# Patient Record
Sex: Female | Born: 1980 | Race: Black or African American | Hispanic: No | Marital: Single | Smoking: Never smoker
Health system: Southern US, Community
[De-identification: ages and names within clinical notes are randomized; demographics above are authoritative.]

## PROBLEM LIST (undated history)

## (undated) DIAGNOSIS — I10 Essential (primary) hypertension: Secondary | ICD-10-CM

## (undated) HISTORY — PX: ABDOMINAL HYSTERECTOMY: SHX81

---

## 2014-03-04 ENCOUNTER — Emergency Department: Payer: Self-pay | Admitting: Emergency Medicine

## 2014-03-04 LAB — COMPREHENSIVE METABOLIC PANEL
ALBUMIN: 3.6 g/dL (ref 3.4–5.0)
ALK PHOS: 89 U/L
AST: 52 U/L — AB (ref 15–37)
Anion Gap: 7 (ref 7–16)
BILIRUBIN TOTAL: 0.2 mg/dL (ref 0.2–1.0)
BUN: 12 mg/dL (ref 7–18)
CO2: 23 mmol/L (ref 21–32)
CREATININE: 0.76 mg/dL (ref 0.60–1.30)
Calcium, Total: 8.7 mg/dL (ref 8.5–10.1)
Chloride: 108 mmol/L — ABNORMAL HIGH (ref 98–107)
EGFR (African American): >60
GLUCOSE: 132 mg/dL — AB (ref 65–99)
OSMOLALITY: 277 (ref 275–301)
Potassium: 4.2 mmol/L (ref 3.5–5.1)
SGPT (ALT): 34 U/L
Sodium: 138 mmol/L (ref 136–145)
TOTAL PROTEIN: 7.6 g/dL (ref 6.4–8.2)

## 2014-03-04 LAB — CBC WITH DIFFERENTIAL/PLATELET
Basophil #: 0.2 10*3/uL — ABNORMAL HIGH (ref 0.0–0.1)
Basophil %: 1.5 %
Eosinophil #: 0 10*3/uL (ref 0.0–0.7)
Eosinophil %: 0.3 %
HCT: 32.1 % — AB (ref 35.0–47.0)
HGB: 9.5 g/dL — ABNORMAL LOW (ref 12.0–16.0)
LYMPHS PCT: 23.4 %
Lymphocyte #: 2.8 10*3/uL (ref 1.0–3.6)
MCH: 22.3 pg — ABNORMAL LOW (ref 26.0–34.0)
MCHC: 29.7 g/dL — AB (ref 32.0–36.0)
MCV: 75 fL — ABNORMAL LOW (ref 80–100)
Monocyte #: 0.5 x10 3/mm (ref 0.2–0.9)
Monocyte %: 4.5 %
NEUTROS PCT: 70.3 %
Neutrophil #: 8.4 10*3/uL — ABNORMAL HIGH (ref 1.4–6.5)
Platelet: 415 10*3/uL (ref 150–440)
RBC: 4.26 10*6/uL (ref 3.80–5.20)
RDW: 27.6 % — ABNORMAL HIGH (ref 11.5–14.5)
WBC: 12 10*3/uL — ABNORMAL HIGH (ref 3.6–11.0)

## 2014-03-04 LAB — URINALYSIS, COMPLETE
Bacteria: NONE SEEN
RBC,UR: 4879 /HPF (ref 0–5)
Specific Gravity: 1.012 (ref 1.003–1.030)
Squamous Epithelial: NONE SEEN
WBC UR: 99 /HPF (ref 0–5)

## 2014-03-04 LAB — PREGNANCY, URINE: Pregnancy Test, Urine: NEGATIVE m[IU]/mL

## 2014-05-07 ENCOUNTER — Emergency Department: Payer: Self-pay | Admitting: Emergency Medicine

## 2014-05-07 LAB — INFLUENZA A,B,H1N1 - PCR (ARMC)
H1N1 flu by pcr: NOT DETECTED
Influenza A By PCR: NEGATIVE
Influenza B By PCR: NEGATIVE

## 2015-02-02 ENCOUNTER — Emergency Department
Admission: EM | Admit: 2015-02-02 | Discharge: 2015-02-02 | Disposition: A | Discharge status: Home / Self Care | Payer: Self-pay | Attending: Emergency Medicine | Admitting: Emergency Medicine

## 2015-02-02 ENCOUNTER — Encounter: Payer: Self-pay | Admitting: Medical Oncology

## 2015-02-02 DIAGNOSIS — M26621 Arthralgia of right temporomandibular joint: Secondary | ICD-10-CM | POA: Insufficient documentation

## 2015-02-02 DIAGNOSIS — I10 Essential (primary) hypertension: Secondary | ICD-10-CM | POA: Insufficient documentation

## 2015-02-02 HISTORY — DX: Essential (primary) hypertension: I10

## 2015-02-02 MED ORDER — MELOXICAM 15 MG PO TABS: 15.0000 mg | ORAL_TABLET | Freq: Every day | ORAL | Status: DC

## 2015-02-02 NOTE — ED Notes (Signed)
Rt sided facial/ ear pain x 4 days.

## 2015-02-02 NOTE — ED Notes (Signed)
AAOx3.  Skin warm and dry.  NAD 

## 2015-02-02 NOTE — ED Notes (Signed)
Having pain to right side of face /ear since last Thursday unsure if the pain is coming from toothache or ear infection. Denies fever or drainage from ear

## 2015-02-02 NOTE — Discharge Instructions (Signed)

## 2015-02-02 NOTE — ED Provider Notes (Signed)
[facility] Emergency Department Provider Note  ____________________________________________  Time seen: Approximately 1:44 PM  I have reviewed the triage vital signs and the nursing notes.   HISTORY  Chief Complaint Facial Pain    HPI Shayma Vaughan SineLumley Furney is a 34 y.o. female presents to the emergency department complaining of right sided face/ear pain since last 4 days ago. She states that the pain has waxed and waned anywhere from a 4 out of 10-10 out of 10. States that the pain can be described as an ache as well as sharp. She denies any injury to area. She denies any previous history of same. She denies any fevers or chills. She denies any difficulty swallowing or breathing.   Past Medical History  Diagnosis Date  . Hypertension     There are no active problems to display for this patient.   Past Surgical History  Procedure Laterality Date  . Abdominal hysterectomy      Current Outpatient Rx  Name  Route  Sig  Dispense  Refill  . meloxicam (MOBIC) 15 MG tablet   Oral   Take 1 tablet (15 mg total) by mouth daily.   30 tablet   0     Allergies Cashew nut oil  No family history on file.  Social History Social History  Substance Use Topics  . Smoking status: Never Smoker   . Smokeless tobacco: None  . Alcohol Use: None    Review of Systems Constitutional: No fever/chills Eyes: No visual changes. ENT: No sore throat. Endorses right-sided jaw/face/ear pain. Cardiovascular: Denies chest pain. Respiratory: Denies shortness of breath. Gastrointestinal: No abdominal pain.  No nausea, no vomiting.  No diarrhea.  No constipation. Genitourinary: Negative for dysuria. Musculoskeletal: Negative for back pain. Skin: Negative for rash. Neurological: Negative for headaches, focal weakness or numbness.  10-point ROS otherwise negative.  ____________________________________________   PHYSICAL EXAM:  VITAL SIGNS: ED Triage Vitals   Enc Vitals Group     BP 02/02/15 1243 146/96 mmHg     Pulse Rate 02/02/15 1243 90     Resp 02/02/15 1243 18     Temp 02/02/15 1243 97.7 F (36.5 C)     Temp Source 02/02/15 1243 Oral     SpO2 02/02/15 1243 97 %     Weight 02/02/15 1243 289 lb (131.09 kg)     Height 02/02/15 1243 5\' 10"  (1.778 m)     Head Cir --      Peak Flow --      Pain Score 02/02/15 1244 7     Pain Loc --      Pain Edu? --      Excl. in GC? --     Constitutional: Alert and oriented. Well appearing and in no acute distress. Eyes: Conjunctivae are normal. PERRL. EOMI. Head: Atraumatic. Grinding and popping with palpation of the TMJ with flexion and extension of the TMJ joint. External auditory canal and TM are unremarkable on right side. Nose: No congestion/rhinnorhea. Mouth/Throat: Mucous membranes are moist.  Oropharynx non-erythematous. Dentition intact with no signs of erythema or edema surrounding same. Uvula is midline and no indication of peritonsillar abscess is identified. Neck: No stridor.   Cardiovascular: Normal rate, regular rhythm. Grossly normal heart sounds.  Good peripheral circulation. Respiratory: Normal respiratory effort.  No retractions. Lungs CTAB. Gastrointestinal: Soft and nontender. No distention. No abdominal bruits. No CVA tenderness. Musculoskeletal: No lower extremity tenderness nor edema.  No joint effusions. Neurologic:  Normal speech and language. No  gross focal neurologic deficits are appreciated. No gait instability. Skin:  Skin is warm, dry and intact. No rash noted. Psychiatric: Mood and affect are normal. Speech and behavior are normal.  ____________________________________________   LABS (all labs ordered are listed, but only abnormal results are displayed)  Labs Reviewed - No data to  display ____________________________________________  EKG   ____________________________________________  RADIOLOGY   ____________________________________________   PROCEDURES  Procedure(s) performed: None  Critical Care performed: No  ____________________________________________   INITIAL IMPRESSION / ASSESSMENT AND PLAN / ED COURSE  Pertinent labs & imaging results that were available during my care of the patient were reviewed by me and considered in my medical decision making (see chart for details).  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 Advised the patient of findings and diagnosis and she verbalizes understanding of the diagnosis. The patient will be placed on anti-inflammatories for this condition and I advised patient to give 2-3 weeks before determining this medication and was a failure. She will follow up with ENT providers should symptoms persist past treatment course. Eyes his understanding and compliance with treatment course. ____________________________________________   FINAL CLINICAL IMPRESSION(S) / ED DIAGNOSES  Final diagnoses:  Arthralgia of right temporomandibular joint      Racheal Patches, PA-C 02/02/15 1400  Darien Ramus, MD 02/02/15 [phone]

## 2015-02-04 ENCOUNTER — Encounter: Payer: Self-pay | Admitting: *Deleted

## 2015-02-04 ENCOUNTER — Emergency Department: Payer: Self-pay

## 2015-02-04 ENCOUNTER — Emergency Department
Admission: EM | Admit: 2015-02-04 | Discharge: 2015-02-04 | Disposition: A | Discharge status: Home / Self Care | Payer: Self-pay | Attending: Emergency Medicine | Admitting: Emergency Medicine

## 2015-02-04 DIAGNOSIS — I10 Essential (primary) hypertension: Secondary | ICD-10-CM | POA: Insufficient documentation

## 2015-02-04 DIAGNOSIS — S8992XA Unspecified injury of left lower leg, initial encounter: Secondary | ICD-10-CM | POA: Insufficient documentation

## 2015-02-04 DIAGNOSIS — Y9389 Activity, other specified: Secondary | ICD-10-CM | POA: Insufficient documentation

## 2015-02-04 DIAGNOSIS — R0789 Other chest pain: Secondary | ICD-10-CM

## 2015-02-04 DIAGNOSIS — S299XXA Unspecified injury of thorax, initial encounter: Secondary | ICD-10-CM | POA: Insufficient documentation

## 2015-02-04 DIAGNOSIS — Z791 Long term (current) use of non-steroidal anti-inflammatories (NSAID): Secondary | ICD-10-CM | POA: Insufficient documentation

## 2015-02-04 DIAGNOSIS — Y92410 Unspecified street and highway as the place of occurrence of the external cause: Secondary | ICD-10-CM | POA: Insufficient documentation

## 2015-02-04 DIAGNOSIS — Y998 Other external cause status: Secondary | ICD-10-CM | POA: Insufficient documentation

## 2015-02-04 DIAGNOSIS — M7918 Myalgia, other site: Secondary | ICD-10-CM

## 2015-02-04 MED ORDER — CYCLOBENZAPRINE HCL 10 MG PO TABS: 10.0000 mg | ORAL_TABLET | Freq: Three times a day (TID) | ORAL | Status: DC | PRN

## 2015-02-04 MED ORDER — IBUPROFEN 800 MG PO TABS: 800.0000 mg | ORAL_TABLET | Freq: Three times a day (TID) | ORAL | Status: DC | PRN

## 2015-02-04 MED ORDER — TRAMADOL HCL 50 MG PO TABS: 50.0000 mg | ORAL_TABLET | Freq: Four times a day (QID) | ORAL | Status: AC | PRN

## 2015-02-04 NOTE — Discharge Instructions (Signed)
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
## 2015-02-04 NOTE — ED Provider Notes (Signed)
EKG viewed and interpreted by attending physician Dr. Governor Rooksebecca Nyjae Hodge, myself  97 bpm. Normal sinus rhythm. Narrow QRS. Normal axis. Nonspecific ST and T-wave.  Governor Rooksebecca Totiana Everson, MD 02/04/15 1725

## 2016-01-04 ENCOUNTER — Emergency Department
Admission: EM | Admit: 2016-01-04 | Discharge: 2016-01-04 | Disposition: A | Discharge status: Home / Self Care | Payer: Self-pay | Attending: Emergency Medicine | Admitting: Emergency Medicine

## 2016-01-04 ENCOUNTER — Emergency Department: Payer: Self-pay

## 2016-01-04 ENCOUNTER — Encounter: Payer: Self-pay | Admitting: Emergency Medicine

## 2016-01-04 DIAGNOSIS — Z791 Long term (current) use of non-steroidal anti-inflammatories (NSAID): Secondary | ICD-10-CM | POA: Insufficient documentation

## 2016-01-04 DIAGNOSIS — R51 Headache: Secondary | ICD-10-CM

## 2016-01-04 DIAGNOSIS — R519 Headache, unspecified: Secondary | ICD-10-CM

## 2016-01-04 DIAGNOSIS — I10 Essential (primary) hypertension: Secondary | ICD-10-CM | POA: Insufficient documentation

## 2016-01-04 LAB — COMPREHENSIVE METABOLIC PANEL
ALBUMIN: 4.2 g/dL (ref 3.5–5.0)
ALK PHOS: 79 U/L (ref 38–126)
ALT: 22 U/L (ref 14–54)
ANION GAP: 6 (ref 5–15)
AST: 36 U/L (ref 15–41)
BILIRUBIN TOTAL: 0.5 mg/dL (ref 0.3–1.2)
BUN: 16 mg/dL (ref 6–20)
CALCIUM: 9.1 mg/dL (ref 8.9–10.3)
CO2: 27 mmol/L (ref 22–32)
Chloride: 105 mmol/L (ref 101–111)
Creatinine, Ser: 0.74 mg/dL (ref 0.44–1.00)
GFR calc Af Amer: >60 mL/min (ref >60)
GFR calc non Af Amer: >60 mL/min (ref >60)
GLUCOSE: 127 mg/dL — AB (ref 65–99)
POTASSIUM: 3.6 mmol/L (ref 3.5–5.1)
SODIUM: 138 mmol/L (ref 135–145)
TOTAL PROTEIN: 7.6 g/dL (ref 6.5–8.1)

## 2016-01-04 LAB — CBC
HEMATOCRIT: 38.3 % (ref 35.0–47.0)
HEMOGLOBIN: 13.3 g/dL (ref 12.0–16.0)
MCH: 30.4 pg (ref 26.0–34.0)
MCHC: 34.6 g/dL (ref 32.0–36.0)
MCV: 87.9 fL (ref 80.0–100.0)
Platelets: 318 10*3/uL (ref 150–440)
RBC: 4.36 MIL/uL (ref 3.80–5.20)
RDW: 13.7 % (ref 11.5–14.5)
WBC: 11 10*3/uL (ref 3.6–11.0)

## 2016-01-04 LAB — TROPONIN I: Troponin I: <0.03 ng/mL (ref <0.03)

## 2016-01-04 NOTE — Discharge Instructions (Signed)

## 2016-01-04 NOTE — ED Provider Notes (Addendum)
[facility] Emergency Department Provider Note  ____________________________________________   First MD Initiated Contact with Patient 01/04/16 [phone]     (approximate)  I have reviewed the triage vital signs and the nursing notes.   HISTORY  Chief Complaint Headache    HPI Heidi Cook is a 35 y.o. female with history of obesity but no other chronic medical issues and who does not take any antihypertensive medications who presents for evaluation of elevated blood pressure and a left-sided headache.  She reports that the headache has been present since earlier today although now it has almost completely resolved.  Here she was feeling "weird" and having headache she had her blood pressure checked while here at [facility] (she works here) and her blood pressure was significantly elevated so she checked into the emergency department.  She denies any numbness or weakness, blurred vision, dizziness, and is in no acute distress at this time.  She denies fever/chills, chest pain, shortness of breath, nausea, vomiting, abdominal pain.  She has had normal urinary habits recently.She reports that her headache has almost completely resolved and is now down to a 1 out of 10.   Past Medical History:  Diagnosis Date  . Hypertension     There are no active problems to display for this patient.   Past Surgical History:  Procedure Laterality Date  . ABDOMINAL HYSTERECTOMY      Prior to Admission medications   Medication Sig Start Date End Date Taking? Authorizing Provider  cyclobenzaprine (FLEXERIL) 10 MG tablet Take 1 tablet (10 mg total) by mouth 3 (three) times daily as needed for muscle spasms. 02/04/15   Chinita Pester, FNP  ibuprofen (ADVIL,MOTRIN) 800 MG tablet Take 1 tablet (800 mg total) by mouth every 8 (eight) hours as needed. 02/04/15   Chinita Pester, FNP  meloxicam (MOBIC) 15 MG tablet Take 1 tablet (15 mg total) by mouth daily. 02/02/15    Delorise Royals Cuthriell, PA-C  traMADol (ULTRAM) 50 MG tablet Take 1 tablet (50 mg total) by mouth every 6 (six) hours as needed. 02/04/15   Chinita Pester, FNP    Allergies Cashew nut oil  History reviewed. No pertinent family history.  Social History Social History  Substance Use Topics  . Smoking status: Never Smoker  . Smokeless tobacco: Never Used  . Alcohol use No    Review of Systems Constitutional: No fever/chills Eyes: No visual changes. ENT: No sore throat. Cardiovascular: Denies chest pain. Respiratory: Denies shortness of breath. Gastrointestinal: No abdominal pain.  No nausea, no vomiting.  No diarrhea.  No constipation. Genitourinary: Negative for dysuria. Musculoskeletal: Negative for back pain. Skin: Negative for rash. Neurological: Negative for focal weakness or numbness.  Prior moderate headache (left-sided), now almost completely resolved.  10-point ROS otherwise negative.  ____________________________________________   PHYSICAL EXAM:  VITAL SIGNS: ED Triage Vitals  Enc Vitals Group     BP 01/04/16 0202 (!) 174/114     Pulse Rate 01/04/16 0202 85     Resp 01/04/16 0202 18     Temp 01/04/16 0202 98 F (36.7 C)     Temp Source 01/04/16 0202 Oral     SpO2 01/04/16 0202 100 %     Weight 01/04/16 0203 285 lb (129.3 kg)     Height 01/04/16 0203 5\' 10"  (1.778 m)     Head Circumference --      Peak Flow --      Pain Score 01/04/16 0203 7  Pain Loc --      Pain Edu? --      Excl. in GC? --     Constitutional: Alert and oriented. Well appearing and in no acute distress. Eyes: Conjunctivae are normal. PERRL. EOMI. No evidence of papilledema on funduscopic exam. Head: Atraumatic. Nose: No congestion/rhinnorhea. Mouth/Throat: Mucous membranes are moist.  Oropharynx non-erythematous. Neck: No stridor.  No meningeal signs.   Cardiovascular: Normal rate, regular rhythm. Good peripheral circulation. Grossly normal heart sounds. Respiratory: Normal  respiratory effort.  No retractions. Lungs CTAB. Gastrointestinal: Soft and nontender. No distention.  Musculoskeletal: No lower extremity tenderness nor edema. No gross deformities of extremities. Neurologic:  Normal speech and language. No gross focal neurologic deficits are appreciated.  Skin:  Skin is warm, dry and intact. No rash noted. Psychiatric: Mood and affect are normal. Speech and behavior are normal.  ____________________________________________   LABS (all labs ordered are listed, but only abnormal results are displayed)  Labs Reviewed  COMPREHENSIVE METABOLIC PANEL - Abnormal; Notable for the following:       Result Value   Glucose, Bld 127 (*)    All other components within normal limits  CBC  TROPONIN I   ____________________________________________  EKG  ED ECG REPORT I, Jonni Oelkers, the attending physician, personally viewed and interpreted this ECG.  Date: 01/04/2016 EKG Time: 02:17 Rate: 79 Rhythm: normal sinus rhythm QRS Axis: normal Intervals: normal ST/T Wave abnormalities: Inverted T-wave in lead 3, otherwise unremarkable Conduction Disturbances: none Narrative Interpretation: unremarkable  ____________________________________________  RADIOLOGY   Dg Chest 2 View  Result Date: 01/04/2016 CLINICAL DATA:  35 year old female with elevated blood pressure and headache. EXAM: CHEST  2 VIEW COMPARISON:  Chest radiograph dated 02/04/2015 FINDINGS: The heart size and mediastinal contours are within normal limits. Both lungs are clear. The visualized skeletal structures are unremarkable. IMPRESSION: No active cardiopulmonary disease. Electronically Signed   By: Elgie CollardArash  Radparvar M.D.   On: 01/04/2016 03:03   Ct Head Wo Contrast  Result Date: 01/04/2016 CLINICAL DATA:  35 year old female with head pressure and elevated blood pressure. EXAM: CT HEAD WITHOUT CONTRAST TECHNIQUE: Contiguous axial images were obtained from the base of the skull through the  vertex without intravenous contrast. COMPARISON:  None. FINDINGS: Brain: No evidence of acute infarction, hemorrhage, hydrocephalus, extra-axial collection or mass lesion/mass effect. Vascular: No hyperdense vessel or unexpected calcification. Skull: Normal. Negative for fracture or focal lesion. Sinuses/Orbits: No acute finding. Other: None IMPRESSION: No acute intracranial pathology. Electronically Signed   By: Elgie CollardArash  Radparvar M.D.   On: 01/04/2016 03:00    ____________________________________________   PROCEDURES  Procedure(s) performed:   Procedures   Critical Care performed: No ____________________________________________   INITIAL IMPRESSION / ASSESSMENT AND PLAN / ED COURSE  Pertinent labs & imaging results that were available during my care of the patient were reviewed by me and considered in my medical decision making (see chart for details).  The patient reports that her headache is also completely resolved and is now down to a 1 out of 10.  She has no neurological symptoms, and vital signs were reassuring except for the fact that her blood pressure is elevated.  She has no evidence of papilledema on funduscopic exam which is reassuring.  I explained to her that given her relatively young age and only having hypertension tonight without a previous diagnosis that I would prefer not to start her on antihypertensives and recommended that she follow-up with her regular doctor as soon as possible to see if  her blood pressure remains elevated.  She has no evidence of hypertensive urgency or emergency and she feels completely normal at this time other than a very slight lingering left-sided headache (essentially asymptomatic hypertension).  I think that she would benefit from outpatient follow-up and evaluation as opposed to starting on antihypertensives in the middle the night in the emergency department even though her diastolic blood pressure is consistently elevated.  This is likely a  long-term issue.  She agrees with this plan and will call in the morning to schedule the next available appointment.    I gave my usual and customary return precautions.      ____________________________________________  FINAL CLINICAL IMPRESSION(S) / ED DIAGNOSES  Final diagnoses:  Essential hypertension  Nonintractable episodic headache, unspecified headache type     MEDICATIONS GIVEN DURING THIS VISIT:  Medications - No data to display   NEW OUTPATIENT MEDICATIONS STARTED DURING THIS VISIT:  New Prescriptions   No medications on file    Modified Medications   No medications on file    Discontinued Medications   No medications on file     Note:  This document was prepared using Dragon voice recognition software and may include unintentional dictation errors.    Loleta Rose, MD 01/04/16 [id number]    Loleta Rose, MD 01/04/16 1191    Loleta Rose, MD 01/04/16 [phone]

## 2016-01-04 NOTE — ED Triage Notes (Signed)
Pt states that she started to feel bad around 2245, pt here working and started having a headache with frontal pressure, pt had her bp checked and noticed that it was elevated, pt denies hx of bp issues. Pt denies blurred vision, dizziness, numbness or tingling, no distress noted at this time

## 2018-09-26 ENCOUNTER — Emergency Department
Admission: EM | Admit: 2018-09-26 | Discharge: 2018-09-26 | Disposition: A | Discharge status: Home / Self Care | Payer: No Typology Code available for payment source | Attending: Emergency Medicine | Admitting: Emergency Medicine

## 2018-09-26 ENCOUNTER — Other Ambulatory Visit: Payer: Self-pay

## 2018-09-26 ENCOUNTER — Emergency Department: Payer: No Typology Code available for payment source

## 2018-09-26 ENCOUNTER — Encounter: Payer: Self-pay | Admitting: Emergency Medicine

## 2018-09-26 DIAGNOSIS — Z79899 Other long term (current) drug therapy: Secondary | ICD-10-CM | POA: Insufficient documentation

## 2018-09-26 DIAGNOSIS — M7918 Myalgia, other site: Secondary | ICD-10-CM | POA: Insufficient documentation

## 2018-09-26 DIAGNOSIS — I10 Essential (primary) hypertension: Secondary | ICD-10-CM | POA: Insufficient documentation

## 2018-09-26 MED ORDER — IBUPROFEN 600 MG PO TABS: 600.0000 mg | ORAL_TABLET | Freq: Four times a day (QID) | ORAL | 0 refills | Status: AC | PRN

## 2018-09-26 MED ORDER — IBUPROFEN 600 MG PO TABS
600.0000 mg | ORAL_TABLET | Freq: Once | ORAL | Status: AC
Start: 2018-09-26 — End: 2018-09-26
  Administered 2018-09-26: 600 mg via ORAL
  Filled 2018-09-26: qty 1

## 2018-09-26 MED ORDER — CYCLOBENZAPRINE HCL 5 MG PO TABS: ORAL_TABLET | ORAL | 0 refills | Status: AC
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
## 2020-08-20 ENCOUNTER — Other Ambulatory Visit: Payer: Self-pay

## 2021-07-13 ENCOUNTER — Emergency Department: Payer: Self-pay

## 2021-07-13 ENCOUNTER — Emergency Department
Admission: EM | Admit: 2021-07-13 | Discharge: 2021-07-13 | Disposition: A | Discharge status: Home / Self Care | Payer: Self-pay | Attending: Emergency Medicine | Admitting: Emergency Medicine

## 2021-07-13 ENCOUNTER — Other Ambulatory Visit: Payer: Self-pay

## 2021-07-13 ENCOUNTER — Encounter: Payer: Self-pay | Admitting: Emergency Medicine

## 2021-07-13 DIAGNOSIS — R7309 Other abnormal glucose: Secondary | ICD-10-CM | POA: Insufficient documentation

## 2021-07-13 DIAGNOSIS — I10 Essential (primary) hypertension: Secondary | ICD-10-CM

## 2021-07-13 DIAGNOSIS — R519 Headache, unspecified: Secondary | ICD-10-CM

## 2021-07-13 LAB — COMPREHENSIVE METABOLIC PANEL
ALT: 22 U/L (ref 0–44)
AST: 36 U/L (ref 15–41)
Albumin: 4.2 g/dL (ref 3.5–5.0)
Alkaline Phosphatase: 70 U/L (ref 38–126)
Anion gap: 12 (ref 5–15)
BUN: 11 mg/dL (ref 6–20)
CO2: 23 mmol/L (ref 22–32)
Calcium: 9.7 mg/dL (ref 8.9–10.3)
Chloride: 98 mmol/L (ref 98–111)
Creatinine, Ser: 0.61 mg/dL (ref 0.44–1.00)
GFR, Estimated: >60 mL/min (ref >60)
Glucose, Bld: 279 mg/dL — ABNORMAL HIGH (ref 70–99)
Potassium: 3.8 mmol/L (ref 3.5–5.1)
Sodium: 133 mmol/L — ABNORMAL LOW (ref 135–145)
Total Bilirubin: 0.9 mg/dL (ref 0.3–1.2)
Total Protein: 8.2 g/dL — ABNORMAL HIGH (ref 6.5–8.1)

## 2021-07-13 LAB — URINALYSIS, ROUTINE W REFLEX MICROSCOPIC
Bacteria, UA: NONE SEEN
Bilirubin Urine: NEGATIVE
Glucose, UA: >=500 mg/dL — AB
Ketones, ur: NEGATIVE mg/dL
Nitrite: NEGATIVE
Protein, ur: NEGATIVE mg/dL
Specific Gravity, Urine: 1.005 (ref 1.005–1.030)
pH: 6 (ref 5.0–8.0)

## 2021-07-13 LAB — CBC WITH DIFFERENTIAL/PLATELET
Abs Immature Granulocytes: 0.06 10*3/uL (ref 0.00–0.07)
Basophils Absolute: 0.1 10*3/uL (ref 0.0–0.1)
Basophils Relative: 1 %
Eosinophils Absolute: 0.1 10*3/uL (ref 0.0–0.5)
Eosinophils Relative: 1 %
HCT: 43.7 % (ref 36.0–46.0)
Hemoglobin: 14.7 g/dL (ref 12.0–15.0)
Immature Granulocytes: 1 %
Lymphocytes Relative: 35 %
Lymphs Abs: 3.6 10*3/uL (ref 0.7–4.0)
MCH: 30.6 pg (ref 26.0–34.0)
MCHC: 33.6 g/dL (ref 30.0–36.0)
MCV: 91 fL (ref 80.0–100.0)
Monocytes Absolute: 0.6 10*3/uL (ref 0.1–1.0)
Monocytes Relative: 5 %
Neutro Abs: 5.9 10*3/uL (ref 1.7–7.7)
Neutrophils Relative %: 57 %
Platelets: 333 10*3/uL (ref 150–400)
RBC: 4.8 MIL/uL (ref 3.87–5.11)
RDW: 11.7 % (ref 11.5–15.5)
WBC: 10.2 10*3/uL (ref 4.0–10.5)
nRBC: 0 % (ref 0.0–0.2)

## 2021-07-13 LAB — TROPONIN I (HIGH SENSITIVITY): Troponin I (High Sensitivity): 8 ng/L (ref <18)

## 2021-07-13 MED ORDER — PROCHLORPERAZINE MALEATE 10 MG PO TABS
10.0000 mg | ORAL_TABLET | Freq: Once | ORAL | Status: AC
  Administered 2021-07-13: 10 mg via ORAL
  Filled 2021-07-13: qty 1

## 2021-07-13 MED ORDER — ACETAMINOPHEN 500 MG PO TABS
1000.0000 mg | ORAL_TABLET | Freq: Once | ORAL | Status: AC
  Administered 2021-07-13: 1000 mg via ORAL
  Filled 2021-07-13: qty 2

## 2021-07-13 NOTE — ED Provider Notes (Signed)
? ?[facility] ?Provider Note ? ? ? Event Date/Time  ? First MD Initiated Contact with Patient 07/13/21 2121   ?  (approximate) ? ? ?History  ? ?Chief Complaint ?Headache ? ? ?HPI ?Heidi Cook is a 41 y.o. female, history of hypertension, presents to the emergency department for evaluation of headache.  Patient initially had multiple complaints, including headache, chills, nosebleed, and nausea that started tonight.  However, most of the symptoms have resolved and now she only has a headache.  She states that the headache started 2 to 3 hours ago, describes as sudden onset, pounding sensation, 8/10.  She states that she does not have a history of migraines.  Denies fever/chills, chest pain, shortness of breath, neck stiffness, abdominal pain, flank pain, nausea/vomiting, diarrhea, vision changes, hearing changes, urinary symptoms, or rashes/lesions. ? ?History Limitations: No limitations. ? ?    ? ? ?Physical Exam  ?Triage Vital Signs: ?ED Triage Vitals  ?Enc Vitals Group  ?   BP 07/13/21 2101 (!) 198/125  ?   Pulse Rate 07/13/21 2101 80  ?   Resp 07/13/21 2101 18  ?   Temp 07/13/21 2101 98.3 ?F (36.8 ?C)  ?   Temp Source 07/13/21 2101 Oral  ?   SpO2 07/13/21 2101 97 %  ?   Weight 07/13/21 2101 250 lb (113.4 kg)  ?   Height 07/13/21 2101 5\' 10"  (1.778 m)  ?   Head Circumference --   ?   Peak Flow --   ?   Pain Score 07/13/21 2101 8  ?   Pain Loc --   ?   Pain Edu? --   ?   Excl. in [place]? --   ? ? ?Most recent vital signs: ?Vitals:  ? 07/13/21 2101 07/13/21 2228  ?BP: (!) 198/125 (!) 166/118  ?Pulse: 80 71  ?Resp: 18 18  ?Temp: 98.3 ?F (36.8 ?C) 98.3 ?F (36.8 ?C)  ?SpO2: 97% 96%  ? ? ?General: Awake, NAD.  ?Skin: Warm, dry. No rashes or lesions.  ?Eyes: PERRL. Conjunctivae normal.  ?Neck: Normal ROM. No nuchal rigidity.  ?CV: Good peripheral perfusion.  ?Resp: Normal effort.  ?Abd: Soft, non-tender. No distention.  ?Neuro: At baseline. No gross neurological deficits.  Cranial nerves II  through XII intact.  Normal finger-nose test.  Patient is able to ambulate on her own without any difficulty. ?MSK: No gross deformities. Normal ROM in all extremities.  ? ? ?Physical Exam ? ? ? ?ED Results / Procedures / Treatments  ?Labs ?(all labs ordered are listed, but only abnormal results are displayed) ?Labs Reviewed  ?COMPREHENSIVE METABOLIC PANEL - Abnormal; Notable for the following components:  ?    Result Value  ? Sodium 133 (*)   ? Glucose, Bld 279 (*)   ? Total Protein 8.2 (*)   ? All other components within normal limits  ?URINALYSIS, ROUTINE W REFLEX MICROSCOPIC - Abnormal; Notable for the following components:  ? Color, Urine STRAW (*)   ? APPearance CLEAR (*)   ? Glucose, UA >=500 (*)   ? Hgb urine dipstick MODERATE (*)   ? Leukocytes,Ua SMALL (*)   ? All other components within normal limits  ?CBC WITH DIFFERENTIAL/PLATELET  ?TROPONIN I (HIGH SENSITIVITY)  ? ? ? ?EKG ?Not applicable. ? ? ?RADIOLOGY ? ?ED Provider Interpretation: I personally reviewed the CT, no acute abnormalities noted based on my interpretation. ? ?CT Head Wo Contrast ? ?Result Date: 07/13/2021 ?CLINICAL DATA:  Headache EXAM: CT  HEAD WITHOUT CONTRAST TECHNIQUE: Contiguous axial images were obtained from the base of the skull through the vertex without intravenous contrast. RADIATION DOSE REDUCTION: This exam was performed according to the departmental dose-optimization program which includes automated exposure control, adjustment of the mA and/or kV according to patient size and/or use of iterative reconstruction technique. COMPARISON:  CT brain 01/04/2016 FINDINGS: Brain: No acute territorial infarction, hemorrhage or intracranial mass. The ventricles are nonenlarged. Vascular: No hyperdense vessels.  No unexpected calcification Skull: Normal. Negative for fracture or focal lesion. Sinuses/Orbits: No acute finding. Other: None IMPRESSION: Negative non contrasted CT appearance of the brain Electronically Signed   By: Donavan Foil M.D.   On: 07/13/2021 21:47   ? ?PROCEDURES: ? ?Critical Care performed: None. ? ?Procedures ? ? ? ?MEDICATIONS ORDERED IN ED: ?Medications  ?prochlorperazine (COMPAZINE) tablet 10 mg (10 mg Oral Given 07/13/21 2146)  ?acetaminophen (TYLENOL) tablet 1,000 mg (1,000 mg Oral Given 07/13/21 2146)  ? ? ? ?IMPRESSION / MDM / ASSESSMENT AND PLAN / ED COURSE  ?I reviewed the triage vital signs and the nursing notes. ?             ?               ? ?Differential diagnosis includes, but is not limited to, epidural/subdural hematoma, subarachnoid hemorrhage, meningitis, migraine, tension headache, cluster headache. ? ?ED Course ?Patient appears well.  Notably hypertensive at 166/118, otherwise normal vitals.  Currently afebrile.  We will go ahead treat with Compazine and acetaminophen. ? ?CBC shows no leukocytosis or anemia. ? ?CMP shows elevated glucose at 279, otherwise no electrolyte abnormalities or transaminitis. ? ?Urinalysis shows elevated glucose, otherwise no evidence of urinary tract infection. ? ?Assessment/Plan ?Presentation consistent with migraine headache.  Head CT reassuring for no evidence of intracranial hemorrhage or lesion.  Lab work-up has been reassuring.  Upon reexamination, patient states that her pain has significantly improved with the Compazine and acetaminophen.  Spoke with her about her hypertension.  She states that she used to be on medications, however she did not feel like they are working, so she stopped taking them over the past few years.  Additionally notified her of the elevated glucose on her labs.  Recommend that she establish with a primary care provider again for management of her hypertension, as well as further testing for diabetes.  Patient expressed understanding and agreed with the plan. ? ?Considered admission for this patient, but given the patient's improvement in symptoms, unremarkable work-up, she is unlikely to benefit. ? ?Provided the patient with anticipatory  guidance, return precautions, and educational material. Encouraged the patient to return to the emergency department at any time if they begin to experience any new or worsening symptoms.  ? ?  ? ? ?FINAL CLINICAL IMPRESSION(S) / ED DIAGNOSES  ? ?Final diagnoses:  ?Hypertension, unspecified type  ?Bad headache  ? ? ? ?Rx / DC Orders  ? ?ED Discharge Orders   ? ? None  ? ?  ? ? ? ?Note:  This document was prepared using Dragon voice recognition software and may include unintentional dictation errors. ?  ?Teodoro Spray, [place] ?07/14/21 0035 ? ?  ?Lavonia Drafts, MD ?07/19/21 0701 ? ?

## 2021-07-13 NOTE — Discharge Instructions (Addendum)
-  Please establish with a primary care provider for follow up on your blood pressure and for diabetes testing. ?-Please return to the emergency department anytime if you begin to experience any new or worsening symptoms. ?-You may take tylenol/ibuprofen as needed for pain for future headaches.  ?

## 2021-07-13 NOTE — ED Triage Notes (Signed)
Pt presents via POV with multiple complaints including: headache, chills, nose bleed, and nausea starting tonight. Hx of HTN and is not currently taking any medication at this time. Denies SOB. ?

## 2022-02-28 ENCOUNTER — Encounter: Payer: Self-pay | Admitting: Emergency Medicine

## 2022-02-28 ENCOUNTER — Emergency Department
Admission: EM | Admit: 2022-02-28 | Discharge: 2022-03-01 | Disposition: A | Discharge status: Home / Self Care | Payer: Self-pay | Attending: Emergency Medicine | Admitting: Emergency Medicine

## 2022-02-28 DIAGNOSIS — B349 Viral infection, unspecified: Secondary | ICD-10-CM | POA: Insufficient documentation

## 2022-02-28 DIAGNOSIS — I10 Essential (primary) hypertension: Secondary | ICD-10-CM | POA: Insufficient documentation

## 2022-02-28 DIAGNOSIS — Z20822 Contact with and (suspected) exposure to covid-19: Secondary | ICD-10-CM | POA: Insufficient documentation

## 2022-02-28 LAB — RESP PANEL BY RT-PCR (FLU A&B, COVID) ARPGX2
Influenza A by PCR: NEGATIVE
Influenza B by PCR: NEGATIVE
SARS Coronavirus 2 by RT PCR: NEGATIVE

## 2022-02-28 NOTE — ED Triage Notes (Signed)
Pt presents via POV with complaints of nasal congestion, fevers, with a productive cough since Thursday. Pt  has treated her sx with OTC sinus and flu medication. Denies CP or SOB.

## 2022-02-28 NOTE — ED Provider Triage Note (Signed)
Emergency Medicine Provider Triage Evaluation Note  Heidi Cook , a 41 y.o. female  was evaluated in triage.  Pt complains of cough, nasal congestion and generalized malaise.  Patient states that she was not aware that her blood pressure was elevated and does not take blood pressure medication at home.  She denies chest pain, chest tightness or abdominal pain.  Review of Systems  Positive: Patient has cough, nasal congestion and myalgias.  Negative: No chest pain or abdominal pain.   Physical Exam  BP (!) 226/126 (BP Location: Left Arm)   Pulse (!) 105   Temp 98.1 F (36.7 C) (Oral)   Resp 20   SpO2 96%  Gen:   Awake, no distress   Resp:  Normal effort  MSK:   Moves extremities without difficulty  Other:    Medical Decision Making  Medically screening exam initiated at 8:56 PM.  Appropriate orders placed.  Heidi Cook was informed that the remainder of the evaluation will be completed by another provider, this initial triage assessment does not replace that evaluation, and the importance of remaining in the ED until their evaluation is complete.     Pia Mau [place], [place] 02/28/22 2059

## 2022-02-28 NOTE — ED Provider Notes (Signed)
[facility] Provider Note    Event Date/Time   First MD Initiated Contact with Patient 02/28/22 2332     (approximate)   History   Nasal Congestion   HPI  Heidi Cook is a 41 y.o. female with past medical history of hypertension presents with nasal congestion cough hoarseness body aches and fever.  Symptoms started about 4 days ago.  She endorses significant nasal congestion coughing that is nonproductive body aches and hoarseness.  Denies significant sore throat.  She has decreased appetite but still eating and drinking.  Had some vomiting initially but is no longer vomiting.  Has had several episodes of diarrhea denies significant abdominal pain.  She did have a coworker that was positive for COVID as she is concerned about this.  Does complain of some dyspnea.  She does have a history of hypertension and was previously on lisinopril but not has not seen a doctor in several years and is not currently on medication.  She has been taking over-the-counter cold and flu medication     Past Medical History:  Diagnosis Date   Hypertension     There are no problems to display for this patient.    Physical Exam  Triage Vital Signs: ED Triage Vitals  Enc Vitals Group     BP 02/28/22 2054 (!) 226/126     Pulse Rate 02/28/22 2054 (!) 105     Resp 02/28/22 2054 20     Temp 02/28/22 2054 98.1 F (36.7 C)     Temp Source 02/28/22 2054 Oral     SpO2 02/28/22 2054 96 %     Weight 02/28/22 2103 247 lb (112 kg)     Height 02/28/22 2103 5\' 10"  (1.778 m)     Head Circumference --      Peak Flow --      Pain Score 02/28/22 2103 0     Pain Loc --      Pain Edu? --      Excl. in [place]? --     Most recent vital signs: Vitals:   02/28/22 2054 02/28/22 2216  BP: (!) 226/126 (!) 194/125  Pulse: (!) 105 92  Resp: 20 19  Temp: 98.1 F (36.7 C) 98.1 F (36.7 C)  SpO2: 96% 95%     General: Awake, no distress.  CV:  Good peripheral perfusion.   Resp:  Normal effort.  Lungs are clear no increased work of breathing Abd:  No distention.  Abdomen is soft Neuro:             Awake, Alert, Oriented x 3  Other:  Dry mucous membranes, no significant erythema or exudate of the posterior oropharynx, uvula midline   ED Results / Procedures / Treatments  Labs (all labs ordered are listed, but only abnormal results are displayed) Labs Reviewed  RESP PANEL BY RT-PCR (FLU A&B, COVID) [id number]     EKG     RADIOLOGY I reviewed and interpreted the CXR which does not show any acute cardiopulmonary process    PROCEDURES:  Critical Care performed: No  Procedures   MEDICATIONS ORDERED IN ED: Medications - No data to display   IMPRESSION / MDM / ASSESSMENT AND PLAN / ED COURSE  I reviewed the triage vital signs and the nursing notes.                              Patient's presentation is  most consistent with acute, uncomplicated illness.  Differential diagnosis includes, but is not limited to, viral illness including COVID RSV influenza, pneumonia, laryngitis, less likely CHF pulmonary edema myocarditis/pericarditis  Patient is a 41 year old female presents with viral type symptoms for 4 days.  She has had cough congestion body aches diarrhea and vomiting.  She is hypertensive here the rest of her vitals are reassuring.  She is mildly hoarse but has no stridor lungs are clear is not in any respiratory distress.  Her posterior oropharyngeal exam is reassuring abdomen is benign.  She does have some shortness of breath and cough so we will obtain a chest x-ray to rule out pneumonia.  Overall my suspicion is that this is a viral process.  Did splint COVID and influenza swabs which are negative.  Chest x-ray is clear.  No evidence of pneumonia.  Discussed with patient her significantly elevated blood pressure.  Will start on amlodipine and referred to primary care.       FINAL CLINICAL IMPRESSION(S) / ED DIAGNOSES   Final  diagnoses:  Viral illness  Hypertension, unspecified type     Rx / DC Orders   ED Discharge Orders          Ordered    amLODipine (NORVASC) 5 MG tablet  Daily        03/01/22 0040             Note:  This document was prepared using Dragon voice recognition software and may include unintentional dictation errors.   Georga Hacking, MD 03/01/22 [phone]

## 2022-02-28 NOTE — ED Provider Notes (Incomplete)
[facility] Provider Note    Event Date/Time   First MD Initiated Contact with Patient 02/28/22 2332     (approximate)   History   Nasal Congestion   HPI  Heidi Cook is a 41 y.o. female with past medical history of hypertension presents with nasal congestion cough hoarseness body aches and fever.  Symptoms started about 4 days ago.  She endorses significant nasal congestion coughing that is nonproductive body aches and hoarseness.  Denies significant sore throat.  She has decreased appetite but still eating and drinking.  Had some vomiting initially but is no longer vomiting.  Has had several episodes of diarrhea denies significant abdominal pain.  She did have a coworker that was positive for COVID as she is concerned about this.  Does complain of some dyspnea.  She does have a history of hypertension and was previously on lisinopril but not has not seen a doctor in several years and is not currently on medication.  She has been taking over-the-counter cold and flu medication     Past Medical History:  Diagnosis Date  . Hypertension     There are no problems to display for this patient.    Physical Exam  Triage Vital Signs: ED Triage Vitals  Enc Vitals Group     BP 02/28/22 2054 (!) 226/126     Pulse Rate 02/28/22 2054 (!) 105     Resp 02/28/22 2054 20     Temp 02/28/22 2054 98.1 F (36.7 C)     Temp Source 02/28/22 2054 Oral     SpO2 02/28/22 2054 96 %     Weight 02/28/22 2103 247 lb (112 kg)     Height 02/28/22 2103 5\' 10"  (1.778 m)     Head Circumference --      Peak Flow --      Pain Score 02/28/22 2103 0     Pain Loc --      Pain Edu? --      Excl. in GC? --     Most recent vital signs: Vitals:   02/28/22 2054 02/28/22 2216  BP: (!) 226/126 (!) 194/125  Pulse: (!) 105 92  Resp: 20 19  Temp: 98.1 F (36.7 C) 98.1 F (36.7 C)  SpO2: 96% 95%     General: Awake, no distress.  CV:  Good peripheral perfusion.   Resp:  Normal effort.  Lungs are clear no increased work of breathing Abd:  No distention.  Abdomen is soft Neuro:             Awake, Alert, Oriented x 3  Other:  Dry mucous membranes, no significant erythema or exudate of the posterior oropharynx, uvula midline   ED Results / Procedures / Treatments  Labs (all labs ordered are listed, but only abnormal results are displayed) Labs Reviewed  RESP PANEL BY RT-PCR (FLU A&B, COVID) [id number]     EKG     RADIOLOGY ***   PROCEDURES:  Critical Care performed: No  Procedures   MEDICATIONS ORDERED IN ED: Medications - No data to display   IMPRESSION / MDM / ASSESSMENT AND PLAN / ED COURSE  I reviewed the triage vital signs and the nursing notes.                              Patient's presentation is most consistent with acute, uncomplicated illness.  Differential diagnosis includes, but is not limited  to, viral illness including COVID RSV influenza, pneumonia, laryngitis, less likely CHF pulmonary edema myocarditis/pericarditis  Patient is a 41 year old female presents with viral type symptoms for 4 days.  She has had cough congestion body aches diarrhea and vomiting.  She is hypertensive here the rest of her vitals are reassuring.  She is mildly hoarse but has no stridor lungs are clear is not in any respiratory distress.  Her posterior oropharyngeal exam is reassuring abdomen is benign.  She does have some shortness of breath and cough so we will obtain a chest x-ray to rule out pneumonia.  Overall my suspicion is that this is a viral process.  Did splint COVID and influenza swabs which are negative.  Discussed with patient her significantly elevated blood pressure.  Will start on amlodipine and referred to primary care.       FINAL CLINICAL IMPRESSION(S) / ED DIAGNOSES   Final diagnoses:  None     Rx / DC Orders   ED Discharge Orders     None        Note:  This document was prepared using Dragon voice  recognition software and may include unintentional dictation errors.

## 2022-03-01 ENCOUNTER — Emergency Department: Payer: Self-pay

## 2022-03-01 MED ORDER — AMLODIPINE BESYLATE 5 MG PO TABS: 5.0000 mg | ORAL_TABLET | Freq: Every day | ORAL | 11 refills | Status: AC

## 2022-03-01 NOTE — Discharge Instructions (Addendum)
Your COVID and influenza test were negative.  Your chest x-ray did not show pneumonia.  You likely have a viral illness that is causing your symptoms.  You can continue to take over-the-counter medications for her symptoms.  Your blood pressure was quite elevated in the emergency department.  It is very important that you follow-up with primary care for management of this.  I have ordered a blood pressure medication called amlodipine which she should start taking daily.

## 2022-08-08 ENCOUNTER — Other Ambulatory Visit: Payer: Self-pay

## 2022-08-08 ENCOUNTER — Emergency Department
Admission: EM | Admit: 2022-08-08 | Discharge: 2022-08-09 | Disposition: A | Discharge status: Home / Self Care | Payer: Self-pay | Attending: Emergency Medicine | Admitting: Emergency Medicine

## 2022-08-08 ENCOUNTER — Encounter: Payer: Self-pay | Admitting: Emergency Medicine

## 2022-08-08 ENCOUNTER — Emergency Department: Payer: Self-pay

## 2022-08-08 DIAGNOSIS — R2241 Localized swelling, mass and lump, right lower limb: Secondary | ICD-10-CM | POA: Insufficient documentation

## 2022-08-08 DIAGNOSIS — M7989 Other specified soft tissue disorders: Secondary | ICD-10-CM

## 2022-08-08 NOTE — ED Triage Notes (Signed)
Pt in via POV, reports right leg pain x one day, also reports noticing some generalized swelling to right leg; states, "I feel like my right leg looks bigger than my left."  Denies any recent injuries.    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  Denies use of blood thinners at this time.    Ambulatory to triage, NAD noted at this time.

## 2022-08-09 NOTE — Discharge Instructions (Signed)
I made a referral to a primary doctor who will call you to schedule an appointment.  If you notice any increase in your swelling then call this doctor for repeat ultrasound in 7 to 10 days to recheck for the possibility of a blood clot that went undetected in today's ultrasound.  Please return to the emergency department if you develop any new, worsening, or unexpected symptoms as we discussed.

## 2022-08-09 NOTE — ED Provider Notes (Signed)
[facility] Provider Note    Event Date/Time   First MD Initiated Contact with Patient 08/09/22 0004     (approximate)   History   Leg Pain   HPI  Heidi Cook is a 42 y.o. female   Past medical history of DVT no longer on anticoagulation she thinks it was unprovoked, who presents to the emergency department for right leg swelling.  1 day of symptoms.  No associated pain redness or fever.  No other acute medical complaints.  No injuries.  No obvious inciting events.  No motor or sensory changes.   External Medical Documents Reviewed: [facility] clinical summary has not seen no listed history of DVT or blood thinner use      Physical Exam   Triage Vital Signs: ED Triage Vitals  Enc Vitals Group     BP 08/08/22 2157 (!) 150/93     Pulse Rate 08/08/22 2157 70     Resp 08/08/22 2157 16     Temp 08/08/22 2157 98.4 F (36.9 C)     Temp Source 08/08/22 2157 Oral     SpO2 08/08/22 2157 96 %     Weight 08/08/22 2151 240 lb (108.9 kg)     Height 08/08/22 2151 5\' 10"  (1.778 m)     Head Circumference --      Peak Flow --      Pain Score 08/08/22 2151 7     Pain Loc --      Pain Edu? --      Excl. in GC? --     Most recent vital signs: Vitals:   08/08/22 2157  BP: (!) 150/93  Pulse: 70  Resp: 16  Temp: 98.4 F (36.9 C)  SpO2: 96%    General: Awake, no distress.  CV:  Good peripheral perfusion.  Resp:  Normal effort.  Abd:  No distention.  Other:  Very mild swelling to the right lower extremity thigh and calf.  Full range of motion neurovascular intact.  No evidence of infection.  With palpation.   ED Results / Procedures / Treatments   Labs (all labs ordered are listed, but only abnormal results are displayed) Labs Reviewed - No data to display     RADIOLOGY I independently reviewed and interpreted ultrasound of the right lower extremity see no obvious blood clot.   PROCEDURES:  Critical Care performed:  No  Procedures   MEDICATIONS ORDERED IN ED: Medications - No data to display   IMPRESSION / MDM / ASSESSMENT AND PLAN / ED COURSE  I reviewed the triage vital signs and the nursing notes.                                Patient's presentation is most consistent with acute presentation with potential threat to life or bodily function.  Differential diagnosis includes, but is not limited to, DVT, cellulitis, infected joint, ischemic limb   The patient is on the cardiac monitor to evaluate for evidence of arrhythmia and/or significant heart rate changes.  MDM: Painless leg swelling for 1 day history of DVT so assess for DVT with an ultrasound which is negative fortunately.  No evidence of infection or ischemia.  Full range of motion.  Looks well.  Plan will be for discharge and PMD referral for repeat ultrasound if swelling remains or increases.  She understands to return with any new or worsening symptoms liver  department.         FINAL CLINICAL IMPRESSION(S) / ED DIAGNOSES   Final diagnoses:  Right leg swelling     Rx / DC Orders   ED Discharge Orders          Ordered    Ambulatory Referral to Primary Care (Establish Care)        08/09/22 0029             Note:  This document was prepared using Dragon voice recognition software and may include unintentional dictation errors.    Pilar Jarvis, MD 08/09/22 [place]

## 2022-09-15 ENCOUNTER — Other Ambulatory Visit: Payer: Self-pay

## 2022-09-15 ENCOUNTER — Emergency Department
Admission: EM | Admit: 2022-09-15 | Discharge: 2022-09-15 | Disposition: A | Discharge status: Home / Self Care | Payer: Self-pay | Attending: Emergency Medicine | Admitting: Emergency Medicine

## 2022-09-15 DIAGNOSIS — M545 Low back pain, unspecified: Secondary | ICD-10-CM | POA: Insufficient documentation

## 2022-09-15 MED ORDER — MELOXICAM 15 MG PO TABS: 15.0000 mg | ORAL_TABLET | Freq: Every day | ORAL | 0 refills | Status: AC

## 2022-09-15 MED ORDER — METHYLPREDNISOLONE 4 MG PO TBPK: ORAL_TABLET | ORAL | 0 refills | Status: AC

## 2022-09-15 NOTE — ED Triage Notes (Signed)
Pt to ED for right sided back pain radiating down right leg since Monday night.

## 2022-09-15 NOTE — Discharge Instructions (Addendum)
Take Meloxicam once daily for pain and inflammation.  If symptoms or not improving with meloxicam, please start Medrol Dosepak

## 2022-09-15 NOTE — ED Provider Notes (Signed)
[facility] Provider Note  Patient Contact: 5:13 PM (approximate)   History   Back Pain   HPI  Heidi Cook is a 42 y.o. female presents to the emergency department with low back pain, mostly right-sided that radiates along the posterior aspect of the right lower extremity.  Patient states that she has been taking Tylenol at home with no relief.  No falls or mechanisms of trauma.  No dysuria, hematuria or increased urinary frequency.  No possibility of pregnancy.  Patient reports that raising her right leg makes symptoms worse.  No fever or chills.      Physical Exam   Triage Vital Signs: ED Triage Vitals  Enc Vitals Group     BP 09/15/22 1627 (!) 177/144     Pulse Rate 09/15/22 1627 86     Resp 09/15/22 1627 18     Temp 09/15/22 1627 99 F (37.2 C)     Temp Source 09/15/22 1627 Oral     SpO2 09/15/22 1627 97 %     Weight 09/15/22 1635 240 lb (108.9 kg)     Height 09/15/22 1635 5\' 10"  (1.778 m)     Head Circumference --      Peak Flow --      Pain Score 09/15/22 1635 9     Pain Loc --      Pain Edu? --      Excl. in GC? --     Most recent vital signs: Vitals:   09/15/22 1627  BP: (!) 177/144  Pulse: 86  Resp: 18  Temp: 99 F (37.2 C)  SpO2: 97%     General: Alert and in no acute distress. Eyes:  PERRL. EOMI. Head: No acute traumatic findings ENT:      Nose: No congestion/rhinnorhea.      Mouth/Throat: Mucous membranes are moist.  Neck: No stridor. No cervical spine tenderness to palpation. Cardiovascular:  Good peripheral perfusion Respiratory: Normal respiratory effort without tachypnea or retractions. Lungs CTAB. Good air entry to the bases with no decreased or absent breath sounds. Gastrointestinal: Bowel sounds 4 quadrants. Soft and nontender to palpation. No guarding or rigidity. No palpable masses. No distention. No CVA tenderness. Musculoskeletal: Full range of motion to all extremities.  Positive straight leg  raise on the right. Neurologic:  No gross focal neurologic deficits are appreciated.  Skin:   No rash noted Other:   ED Results / Procedures / Treatments   Labs (all labs ordered are listed, but only abnormal results are displayed) Labs Reviewed - No data to display        PROCEDURES:  Critical Care performed: No  Procedures   MEDICATIONS ORDERED IN ED: Medications - No data to display   IMPRESSION / MDM / ASSESSMENT AND PLAN / ED COURSE  I reviewed the triage vital signs and the nursing notes.                              Assessment and plan Low back pain 42 year old female presents to the emergency department with low back pain and right lower extremity radiculopathy.  Patient was hypertensive at triage but vital signs were otherwise reassuring.  On exam, patient alert and nontoxic-appearing.  Patient had a positive straight leg raise test.  Prescribed patient meloxicam daily for 1 week.  Advised patient to start Medrol Dosepak if patient does not experience symptomatic relief with meloxicam.  Return precautions were  given to return with new or worsening symptoms.  All patient questions were answered.      FINAL CLINICAL IMPRESSION(S) / ED DIAGNOSES   Final diagnoses:  Acute bilateral low back pain without sciatica     Rx / DC Orders   ED Discharge Orders          Ordered    meloxicam (MOBIC) 15 MG tablet  Daily        09/15/22 1712    methylPREDNISolone (MEDROL DOSEPAK) 4 MG TBPK tablet        09/15/22 1712             Note:  This document was prepared using Dragon voice recognition software and may include unintentional dictation errors.   Pia [employer], PA-C 09/15/22 1716    Jene Every, MD 09/15/22 [phone]

## 2023-01-08 IMAGING — CT CT HEAD W/O CM
4 series · 17 of 47 positions shown, 19 images · non-contrast
Comparison: CT brain 01/04/2016

CLINICAL DATA: Headache



[Series 2: head wo · axial · 0.43mm/px · z∈[-121,-1]mm · 7 of 34 slices shown, 9 images]
[im 5/34  brain]
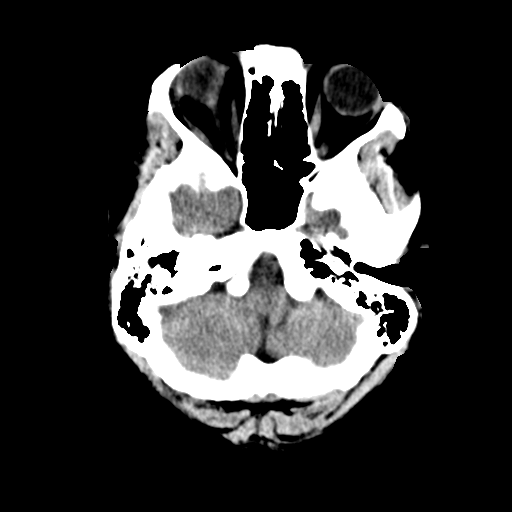
[im 5/34  bone]
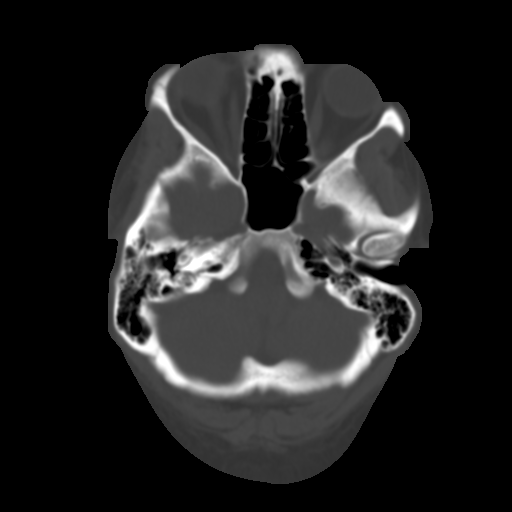
[im 9/34  brain]
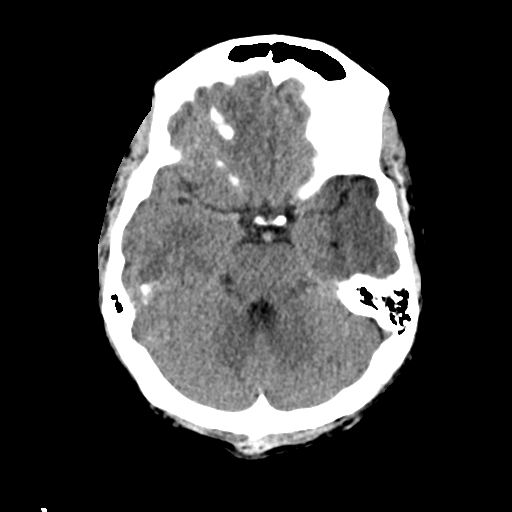
[im 13/34  brain]
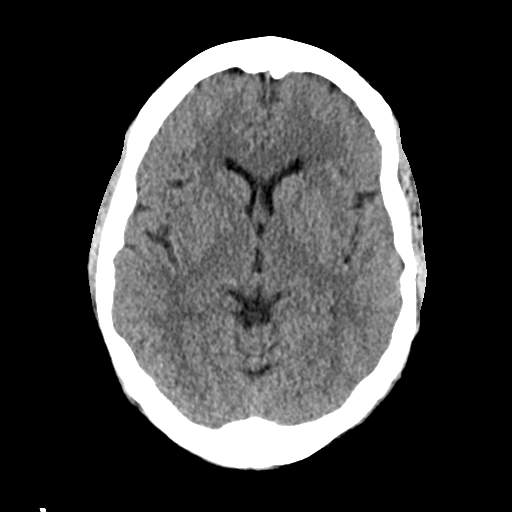
[im 17/34  brain]
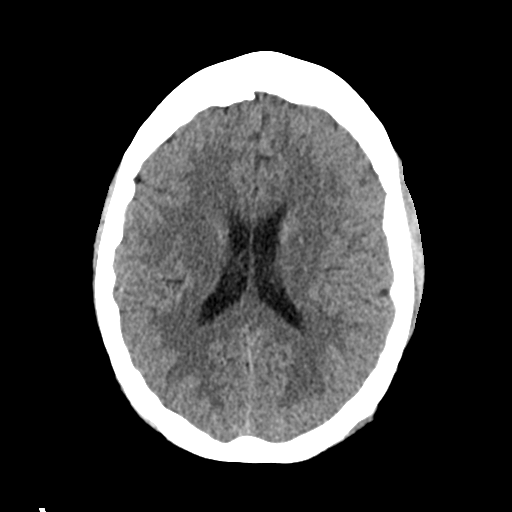
[im 21/34  brain]
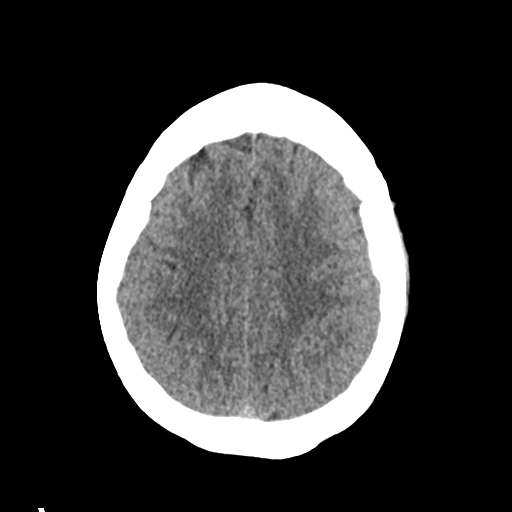
[im 21/34  bone]
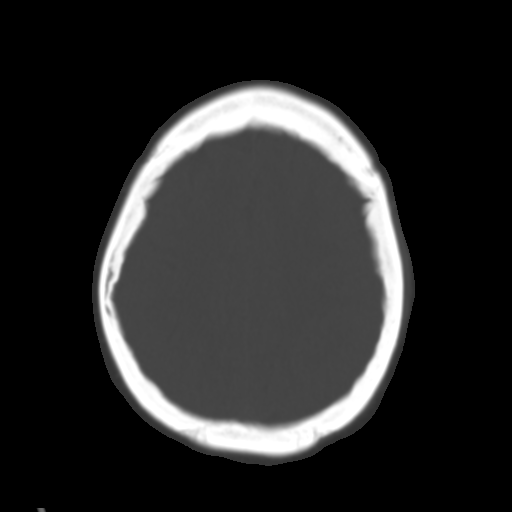
[im 25/34  brain]
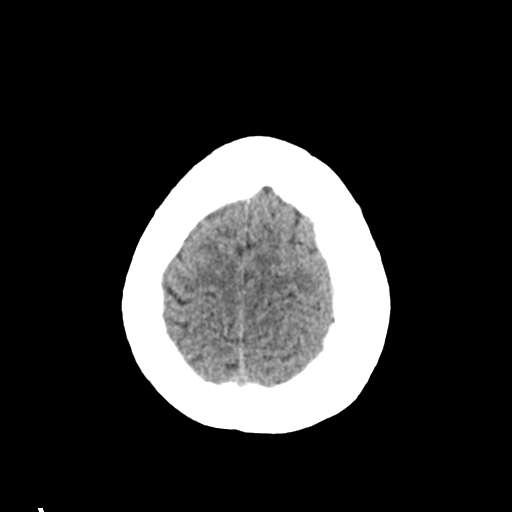
[im 29/34  brain]
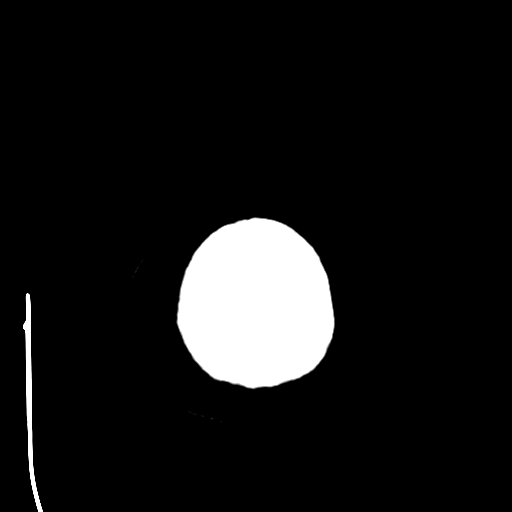

[Series 3: head bone · axial · 0.43mm/px · z∈[-125,-67]mm · 4 of 85 slices shown]
[im 9/85  bone]
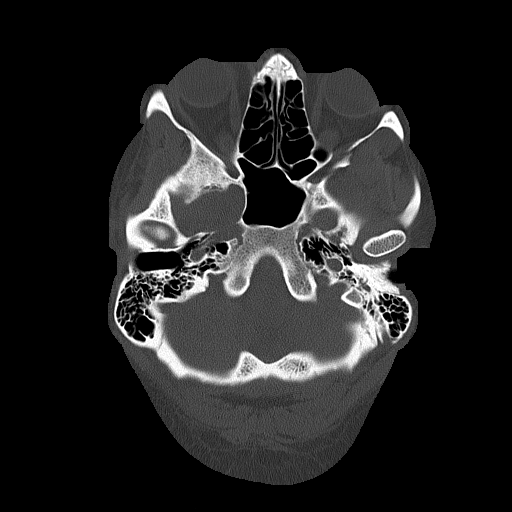
[im 17/85  bone]
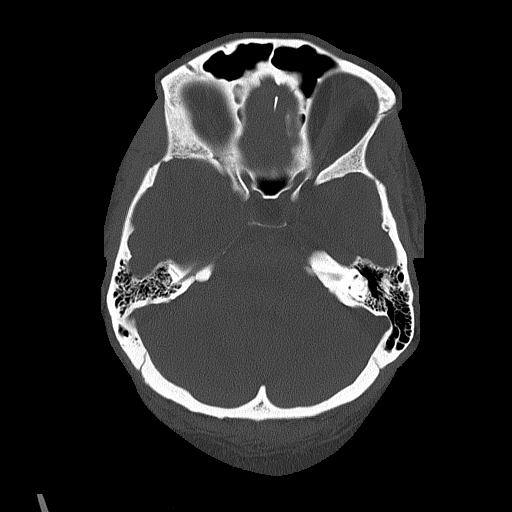
[im 26/85  bone]
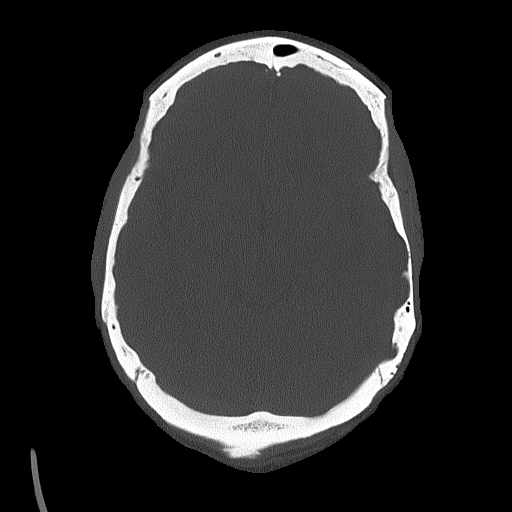
[im 38/85  bone]
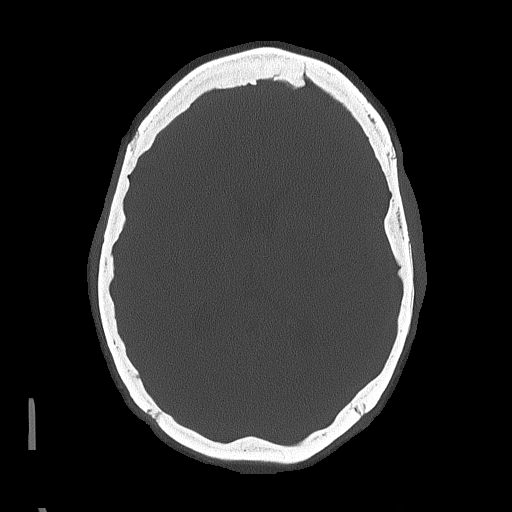

[Series 4: coronal soft tissue · coronal · 0.34mm/px · 3 of 62 slices shown]
[im 21/62  brain]
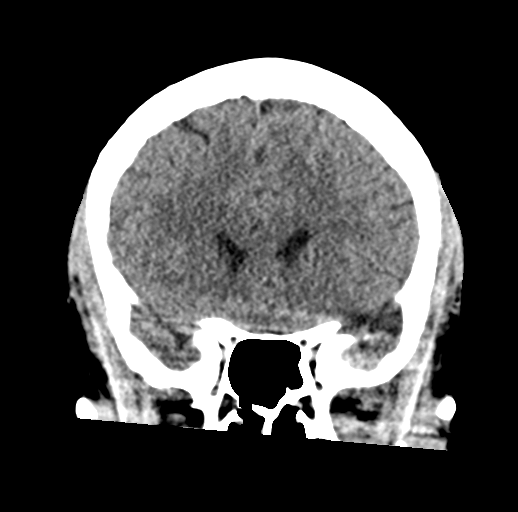
[im 28/62  brain]
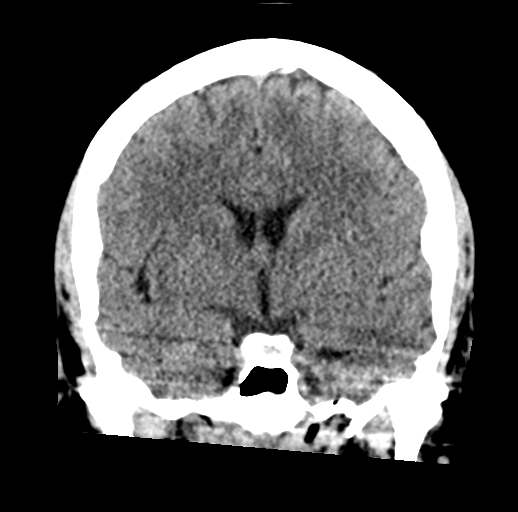
[im 34/62  brain]
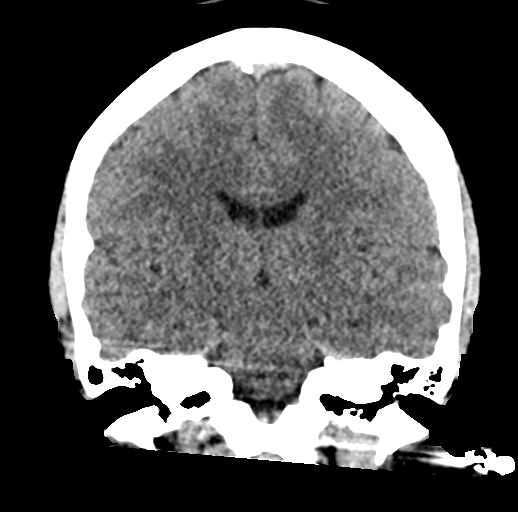

[Series 5: sagittal soft tissue · sagittal · 0.33mm/px · 3 of 53 slices shown]
[im 18/53  brain]
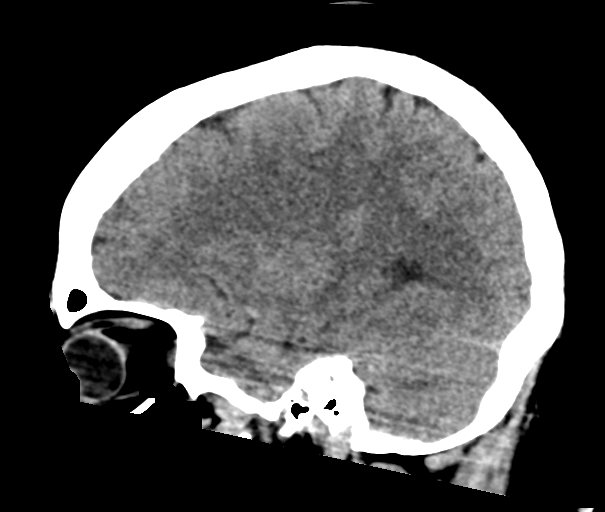
[im 27/53  brain]
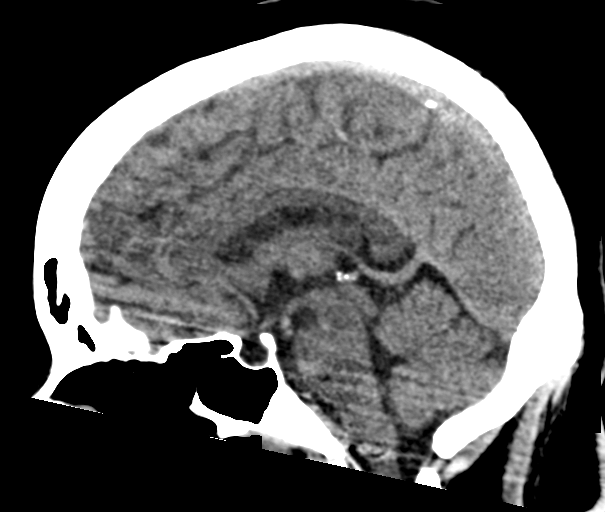
[im 35/53  brain]
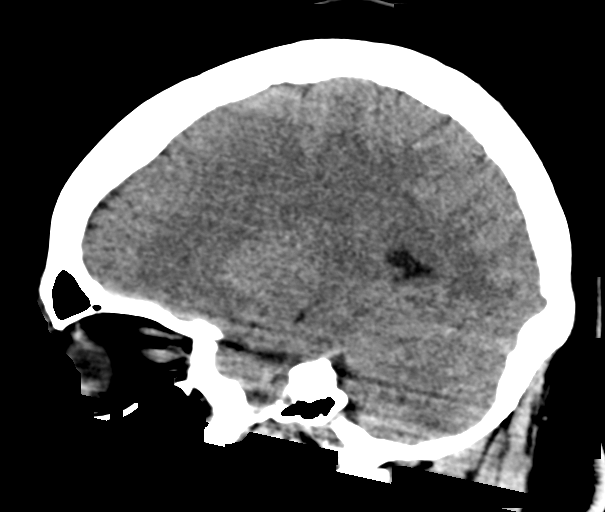

[17 of 47 positions shown; findings below may reference images not displayed]

FINDINGS: Brain: No acute territorial infarction, hemorrhage or intracranial
mass. The ventricles are nonenlarged.

Vascular: No hyperdense vessels.  No unexpected calcification

Skull: Normal. Negative for fracture or focal lesion.

Sinuses/Orbits: No acute finding.

Other: None
IMPRESSION: Negative non contrasted CT appearance of the brain

## 2023-05-17 NOTE — ED Provider Notes (Signed)
 ED Progress Note  Received sign out from previous provider.  Patient Summary: Heidi Cook is a 43 y.o. female who present for evaluation of abdominal pain. Action List:  Follow-up urinalysis and ultrasound.  Updates ED Course as of 05/17/23 0008  Wed May 17, 2023  0003 US  pelvis: IMPRESSION:   --No ovarian torsion. --Hysterectomy.  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  Similar to prior. Pain controlled. Discussed ongoing outpatient follow up. Pt okay with plan of care.   Patient ports that the last medication she was given was helpful for her pain.  Will prescribe a short course of gabapentin .  Return precautions provided.

## 2023-06-15 NOTE — Progress Notes (Signed)
 ASSESSMENT/PLAN  Assessment & Plan Hypertension, unspecified type - Average BP 161/110 and 157/110.  - Increase Amlodipine  from 5 mg to 10 mg once a day. Start on Losartan 25 mg once a day today.  - Will draw BMP in 1 month. Advised follow up in 1 month for BP recheck.   Orders: .  losartan (COZAAR) 25 MG tablet; Take 1 tablet (25 mg total) by mouth daily.  Type 2 diabetes mellitus without complication, without long-term current use of insulin (CMS-HCC) - Starting on Metformin 500 mg once a day today.  - Possible side effects discussed.  - Advised to increase frequency of Metformin to 500 mg BID after 1 week.   Lab Results  Component Value Date   A1C 6.6 (H) 06/05/2023    Orders: .  metFORMIN (GLUCOPHAGE) 500 MG tablet; Take 1 tablet (500 mg total) by mouth in the morning and 1 tablet (500 mg total) in the evening. Take with meals.  Hypertriglyceridemia - Most recent labs discussed.  - Will consider starting statin at future visit.   Lab Results  Component Value Date   CHOL 196 06/05/2023   Lab Results  Component Value Date   HDL 46 06/05/2023   Lab Results  Component Value Date   LDL 84 06/05/2023   Lab Results  Component Value Date   VLDL 66 (H) 06/05/2023   Lab Results  Component Value Date   CHOLHDLRATIO 4.3 06/05/2023   Lab Results  Component Value Date   TRIG 330 (H) 06/05/2023        I personally spent 30 minutes face-to-face and non-face-to-face in the care of this patient, which includes all pre, intra, and post visit time on the date of service.  SUBJECTIVE  Subjective This is a 43 y.o. female who presents with had concerns including Follow-up.   HTN: Pt states that she has been been checking her BPs at home which have still been elevated. She has been compliant with Amlodipine  as prescribed. She denies any side effects with the medication. She denies any swelling, dizziness, nausea, or vomiting.   DM II: Pt reports that she is open to being  started on a medication for diabetes. Pt admits that she was on Metformin in the past very briefly.   Hypertriglyceridemia: Pt admits that she did not fast during the last time she had her labs drawn.     REVIEW OF SYSTEMS  Past medical history, medications, social history, and allergies reviewed and updated. Remainder of 10 of 13 systems review negative except noted in the HPI.      Objective  OBJECTIVE BP 166/111 (BP Site: L Arm, BP Position: Sitting, BP Cuff Size: Large)   Pulse 88   Temp 36.3 C (97.3 F) (Temporal)   Wt (!) 112.6 kg (248 lb 3.2 oz)   BMI 35.61 kg/m    Repeat BP is 161/110 and 157/110   Physical Examination Vital Signs- reviewed General appearance - alert, well appearing, and in no distress Mental status - alert, oriented to person, place, and time Eyes - pupils equal and reactive, extraocular eye movements intact Ears - bilateral TM's and external ear canals normal Nose - normal and patent, no erythema, discharge or polyps Mouth - mucous membranes moist Chest - clear to auscultation, no wheezes, rales or rhonchi, symmetric air entry Heart - normal rate, regular rhythm without murmurs, rubs, clicks or gallops Neurological - alert, oriented, normal speech, no focal findings or movement disorder noted Extremities - no pedal edema,  no clubbing or cyanosis Skin - normal coloration and turgor, no rashes, no suspicious skin lesions noted Musculoskeletal- full ROM in bilateral upper and lower, strength testing in extremities was equal/bilateral   Labs, Imaging, and Other Clinical Data:  I have reviewed the labs, imaging studies, and other clinical data associated with this encounter. See Epic Labs and Imaging section for details.

## 2023-07-20 NOTE — Progress Notes (Signed)
 [facility] Established Patient Clinic Note  Assessment/Plan: Heidi Cook is a 43 y.o.female  Problem List Items Addressed This Visit     High blood pressure   Blood pressure goal <130/80. At visit today diastolic above goal. Prior was in the 160-150/100s. Patient started home reading have been around this level. At last visit amlodipine  increased to 10 mg and started on losartan. Patient has been taking with no issues., - Will continue current regimen - BMP today - Future consideration: increase losartan - Discussed obtaining lipids and consideration starting statin, would like to have leg pain under control prior to starting.       Relevant Orders   Basic metabolic panel (Completed)   Pain of right hip - Primary   Right hip pain since August that comes and goes. Work up in ED at initial time overall unremarkable besides cyst in vaginal cuff that was told possible contributing. Patient seen outpatient for this and feels like does not correlate with pain that is more on the internal right hip described as pressure. Worse with certain movements. Does endorse numbness/tingling and sensation of cool along the lateral anterior part. Exam with minor weakness with extension/flexion at the hip. Sensation and reflexes intact. Consider arthritis vs radiculopathy vs FAIA vs other cause.  - Will obtain xray - Continue treatment with NSAIDs and tylenol  as helps - Formal PT ordered - If no improvement or worsening in 6-8 weeks consider sports medicine referral vs mri - strict return precautions discussed      Relevant Orders   XR Hip Right w Pelvis 2 or 3 Views (Completed)   Physical Therapy   Prediabetes   One A1c at 6.6 last visit. Otherwise has been pre-diabetic range. Started on metformin 500 mg BID which patient has been tolerating well - Continue metformin BID - Recheck A1c in 3-6 months        Assessment & Plan     No follow-ups on file.  Attending:  Dr. Murvin    Subjective  Heidi Cook is a 43 y.o. female  coming to clinic today for the following issues:  Chief Complaint  Patient presents with  . Follow-up   HPI:  History of Present Illness Heidi Cook is a 43 year old female who presents with right-sided hip and leg pain and urinary symptoms.  She has been experiencing right-sided hip and leg pain since August. The pain originates at the waistline on the right side, extends down the leg to the foot, and is described as aching with occasional cooling sensations like water rushing over the leg. It primarily affects the side of the leg and sometimes involves the lower back. The pain is severe enough to cause her to miss work and fluctuates in intensity without a clear trigger. She has attempted exercises, Tylenol , and ibuprofen  for relief, which provide some benefit but do not completely alleviate the pain.  She also experiences urinary symptoms, including difficulty determining when her bladder is empty and needing to push to urinate. These urinary symptoms have occurred previously during episodes of severe pain.  In August, she was evaluated in the emergency department where a cyst was discovered. She returned for further evaluation in November when the pain intensified, but no definitive diagnosis was made. Occasionally, the pain is accompanied by numbness and tingling in the smaller toes of her right foot, though this is inconsistent.  Her current medications include amlodipine  10 mg and losartan 25 mg for blood pressure management, and metformin  500 mg twice a day for prediabetes. Her blood pressure has been stable, and she tolerates her medications well.  No pain with urination, incontinence, or trauma. No falls, clicking, or popping in the hip. Pain occurs when bending towards her toes and pressure with certain movements, but no significant pain in the lower back. No gastrointestinal issues.   I have reviewed the  problem list, medications, and allergies and have updated/reconciled them if needed.  Heidi Cook  reports that she has never smoked. She has never used smokeless tobacco. Health Maintenance  Topic Date Due  . Hepatitis C Screen  Never done  . DTaP/Tdap/Td Vaccines (2 - Td or Tdap) 04/04/2018  . COVID-19 Vaccine (4 - 2024-25 season) 12/04/2022  . Influenza Vaccine (Season Ended) 12/04/2023  . Hemoglobin A1c  06/04/2024  . Serum Creatinine Monitoring  06/04/2024  . Potassium Monitoring  06/04/2024  . Mammogram  06/15/2025  . Lipid Screening  06/04/2028  . Pneumococcal Vaccine 0-49  Aged Out    Objective   VITALS: BP 133/90 (BP Site: R Arm, BP Position: Sitting, BP Cuff Size: Large)   Pulse 94   Temp 36.2 C (97.2 F) (Temporal)   Ht 177.8 cm (5' 10) Comment: pt reported  Wt (!) 113.9 kg (251 lb 3.2 oz)   BMI 36.04 kg/m   Physical Exam Vitals reviewed.  Constitutional:      Appearance: Normal appearance.  Cardiovascular:     Rate and Rhythm: Normal rate and regular rhythm.     Pulses: Normal pulses.  Pulmonary:     Effort: Pulmonary effort is normal.  Neurological:     Mental Status: She is oriented to person, place, and time.  Psychiatric:        Mood and Affect: Mood normal.        Behavior: Behavior normal.   Comprehensive Right Hip Exam: Inspection:  Ecchymosis: No TTP:  iliac crest, proximal IT Band ROM Int: Normal Ext: Normal Flexion: Normal Strength: Abd: 5/5 Flex: 4/5 Special Test: Log roll: Negative FADIR: Positive for pressure but no shooting pain FABER: Positive for pressure but no shooting pain Leg lengths: equal Supine to sit test: Negative   LABS/IMAGING I have reviewed pertinent recent labs and imaging in Epic  [facility] [place]  at [facility] [address], [place], [place] [address] . Telephone [phone] . Fax [phone] [email or web address]

## 2023-07-21 NOTE — Progress Notes (Signed)
Immediately after or during the visit, I reviewed with the resident the medical history and the resident’s findings on physical examination.  I discussed with the resident the patient’s diagnosis and concur with the treatment plan as documented in the resident note. Rita M Lahlou, MD

## 2023-11-20 ENCOUNTER — Other Ambulatory Visit: Payer: Self-pay

## 2023-11-20 ENCOUNTER — Emergency Department
Admission: EM | Admit: 2023-11-20 | Discharge: 2023-11-20 | Disposition: A | Discharge status: Home / Self Care | Payer: Self-pay

## 2023-11-20 DIAGNOSIS — R110 Nausea: Secondary | ICD-10-CM | POA: Insufficient documentation

## 2023-11-20 DIAGNOSIS — I10 Essential (primary) hypertension: Secondary | ICD-10-CM | POA: Insufficient documentation

## 2023-11-20 DIAGNOSIS — M5431 Sciatica, right side: Secondary | ICD-10-CM | POA: Insufficient documentation

## 2023-11-20 DIAGNOSIS — N3000 Acute cystitis without hematuria: Secondary | ICD-10-CM | POA: Insufficient documentation

## 2023-11-20 DIAGNOSIS — M5430 Sciatica, unspecified side: Secondary | ICD-10-CM

## 2023-11-20 DIAGNOSIS — I159 Secondary hypertension, unspecified: Secondary | ICD-10-CM | POA: Insufficient documentation

## 2023-11-20 DIAGNOSIS — D72829 Elevated white blood cell count, unspecified: Secondary | ICD-10-CM | POA: Insufficient documentation

## 2023-11-20 LAB — COMPREHENSIVE METABOLIC PANEL WITH GFR
ALT: 18 U/L (ref 0–44)
AST: 32 U/L (ref 15–41)
Albumin: 4.5 g/dL (ref 3.5–5.0)
Alkaline Phosphatase: 69 U/L (ref 38–126)
Anion gap: 13 (ref 5–15)
BUN: 11 mg/dL (ref 6–20)
CO2: 19 mmol/L — ABNORMAL LOW (ref 22–32)
Calcium: 9.7 mg/dL (ref 8.9–10.3)
Chloride: 104 mmol/L (ref 98–111)
Creatinine, Ser: 0.66 mg/dL (ref 0.44–1.00)
GFR, Estimated: >60 mL/min (ref >60)
Glucose, Bld: 180 mg/dL — ABNORMAL HIGH (ref 70–99)
Potassium: 3.5 mmol/L (ref 3.5–5.1)
Sodium: 136 mmol/L (ref 135–145)
Total Bilirubin: 1.3 mg/dL — ABNORMAL HIGH (ref 0.0–1.2)
Total Protein: 8.3 g/dL — ABNORMAL HIGH (ref 6.5–8.1)

## 2023-11-20 LAB — CBC
HCT: 43.2 % (ref 36.0–46.0)
Hemoglobin: 14.7 g/dL (ref 12.0–15.0)
MCH: 30.9 pg (ref 26.0–34.0)
MCHC: 34 g/dL (ref 30.0–36.0)
MCV: 90.9 fL (ref 80.0–100.0)
Platelets: 438 K/uL — ABNORMAL HIGH (ref 150–400)
RBC: 4.75 MIL/uL (ref 3.87–5.11)
RDW: 12.2 % (ref 11.5–15.5)
WBC: 12.4 K/uL — ABNORMAL HIGH (ref 4.0–10.5)
nRBC: 0 % (ref 0.0–0.2)

## 2023-11-20 LAB — URINALYSIS, ROUTINE W REFLEX MICROSCOPIC
Bacteria, UA: NONE SEEN
Bilirubin Urine: NEGATIVE
Glucose, UA: NEGATIVE mg/dL
Ketones, ur: 20 mg/dL — AB
Nitrite: NEGATIVE
Protein, ur: 30 mg/dL — AB
Specific Gravity, Urine: 1.017 (ref 1.005–1.030)
WBC, UA: >50 WBC/hpf (ref 0–5)
pH: 5 (ref 5.0–8.0)

## 2023-11-20 LAB — RESP PANEL BY RT-PCR (RSV, FLU A&B, COVID)  RVPGX2
Influenza A by PCR: NEGATIVE
Influenza B by PCR: NEGATIVE
Resp Syncytial Virus by PCR: NEGATIVE
SARS Coronavirus 2 by RT PCR: NEGATIVE

## 2023-11-20 LAB — LIPASE, BLOOD: Lipase: 44 U/L (ref 11–51)

## 2023-11-20 MED ORDER — GABAPENTIN 300 MG PO CAPS
300.0000 mg | ORAL_CAPSULE | Freq: Once | ORAL | Status: AC
  Administered 2023-11-20: 300 mg via ORAL
  Filled 2023-11-20: qty 1

## 2023-11-20 MED ORDER — ONDANSETRON HCL 4 MG/2ML IJ SOLN
4.0000 mg | Freq: Once | INTRAMUSCULAR | Status: AC
  Administered 2023-11-20: 4 mg via INTRAVENOUS
  Filled 2023-11-20: qty 2

## 2023-11-20 MED ORDER — SODIUM CHLORIDE 0.9 % IV SOLN
1.0000 g | Freq: Once | INTRAVENOUS | Status: AC
  Administered 2023-11-20: 1 g via INTRAVENOUS
  Filled 2023-11-20: qty 10

## 2023-11-20 MED ORDER — SODIUM CHLORIDE 0.9 % IV BOLUS
1000.0000 mL | Freq: Once | INTRAVENOUS | Status: AC
  Administered 2023-11-20: 1000 mL via INTRAVENOUS

## 2023-11-20 MED ORDER — FAMOTIDINE IN NACL 20-0.9 MG/50ML-% IV SOLN
20.0000 mg | Freq: Once | INTRAVENOUS | Status: AC
  Administered 2023-11-20: 20 mg via INTRAVENOUS
  Filled 2023-11-20: qty 50

## 2023-11-20 MED ORDER — ONDANSETRON 4 MG PO TBDP: 4.0000 mg | ORAL_TABLET | Freq: Three times a day (TID) | ORAL | 0 refills | Status: AC | PRN

## 2023-11-20 MED ORDER — CEFDINIR 300 MG PO CAPS: 300.0000 mg | ORAL_CAPSULE | Freq: Two times a day (BID) | ORAL | 0 refills | Status: AC

## 2023-11-20 NOTE — Discharge Instructions (Signed)
 You were seen in the emergency department for back pain, worsening sciatic symptoms, nausea.  Workup in our emergency department was reassuring today.  You did have evidence of a urinary tract infection.  Please take your antibiotics as prescribed.  Ensure adequate hydration.  I suspect this is the reason why your blood pressure has been elevated however you should monitor this at home over the next week and if it still remains elevated please call your primary care physician.  Continue to work with physical therapy.  Return with any acutely worsening symptoms or any other emergency. -- RETURN PRECAUTIONS & AFTERCARE: (ENGLISH) RETURN PRECAUTIONS: Return immediately to the emergency department or see/call your doctor if you feel worse, weak or have changes in speech or vision, are short of breath, have fever, vomiting, pain, bleeding or dark stool, trouble urinating or any new issues. Return here or see/call your doctor if not improving as expected for your suspected condition. FOLLOW-UP CARE: Call your doctor and/or any doctors we referred you to for more advice and to make an appointment. Do this today, tomorrow or after the weekend. Some doctors only take PPO insurance so if you have HMO insurance you may want to contact your HMO or your regular doctor for referral to a specialist within your plan. Either way tell the doctor's office that it was a referral from the emergency department so you get the soonest possible appointment.  YOUR TEST RESULTS: Take result reports of any blood or urine tests, imaging tests and EKG's to your doctor and any referral doctor. Have any abnormal tests repeated. Your doctor or a referral doctor can let you know when this should be done. Also make sure your doctor contacts this hospital to get any test results that are not currently available such as cultures or special tests for infection and final imaging reports, which are often not available at the time you leave the ER but  which may list additional important findings that are not documented on the preliminary report. BLOOD PRESSURE: If your blood pressure was greater than 120/80 have your blood pressure rechecked within 1 to 2 weeks. MEDICATION SIDE EFFECTS: Do not drive, walk, bike, take the bus, etc. if you have received or are being prescribed any sedating medications such as those for pain or anxiety or certain antihistamines like Benadryl. If you have been give one of these here get a taxi home or have a friend drive you home. Ask your pharmacist to counsel you on potential side effects of any new medication

## 2023-11-20 NOTE — ED Provider Notes (Signed)
 [facility] Provider Note    Event Date/Time   First MD Initiated Contact with Patient 11/20/23 2105     (approximate)   History   Nausea and Back Pain   HPI  Heidi Cook is a 43 y.o. female with a long history of sciatica and physical therapy, hypertension who presents with worsening right-sided back pain that radiates down to her knee, 3 days of nausea and elevated blood pressure.  Patient reports that she works with physical therapy for her sciatica but does not take any medications regularly.  She did take her amlodipine  for her high blood pressure earlier today.  For the past 3 days she has felt nauseous but denies any abdominal pain.  She does have congestion but has not measured any fevers.  No shortness of breath chest pain or changes in urinary habits.  She does have a history of a hysterectomy.  She presents with her friend who contributes to the history      Physical Exam   Triage Vital Signs: ED Triage Vitals  Encounter Vitals Group     BP 11/20/23 2052 (!) 174/111     Girls Systolic BP Percentile --      Girls Diastolic BP Percentile --      Boys Systolic BP Percentile --      Boys Diastolic BP Percentile --      Pulse Rate 11/20/23 2052 96     Resp 11/20/23 2052 18     Temp 11/20/23 2052 98.3 F (36.8 C)     Temp Source 11/20/23 2052 Oral     SpO2 11/20/23 2052 98 %     Weight 11/20/23 2051 240 lb (108.9 kg)     Height 11/20/23 2051 5' 10 (1.778 m)     Head Circumference --      Peak Flow --      Pain Score 11/20/23 2051 8     Pain Loc --      Pain Education --      Exclude from Growth Chart --     Most recent vital signs: Vitals:   11/20/23 2052  BP: (!) 174/111  Pulse: 96  Resp: 18  Temp: 98.3 F (36.8 C)  SpO2: 98%    Nursing Triage Note reviewed. Vital signs reviewed and patients oxygen saturation is normoxic  General: Patient is well nourished, well developed, awake and alert, resting comfortably in no  acute distress Head: Normocephalic and atraumatic Eyes: Normal inspection, extraocular muscles intact, no conjunctival pallor Ear, nose, throat: Normal external exam Neck: Normal range of motion Respiratory: Patient is in no respiratory distress, lungs CTAB Cardiovascular: Patient is not tachycardic, RRR without murmur appreciated GI: Abd SNT with no guarding or rebound  Back: Normal inspection of the back with good strength and range of motion throughout all ext No CT or L-spine tenderness to palpation.  Straight leg test on the right does cause pain in the patient's back Extremities: pulses intact with good cap refills, no LE pitting edema or calf tenderness Neuro: The patient is alert and oriented to person, place, and time, appropriately conversive, with 5/5 bilat UE/LE strength, no gross motor or sensory defects noted. Coordination appears to be adequate. Skin: Warm, dry, and intact Psych: normal mood and affect, no SI or HI  ED Results / Procedures / Treatments   Labs (all labs ordered are listed, but only abnormal results are displayed) Labs Reviewed  COMPREHENSIVE METABOLIC PANEL WITH GFR - Abnormal; Notable  for the following components:      Result Value   CO2 19 (*)    Glucose, Bld 180 (*)    Total Protein 8.3 (*)    Total Bilirubin 1.3 (*)    All other components within normal limits  CBC - Abnormal; Notable for the following components:   WBC 12.4 (*)    Platelets 438 (*)    All other components within normal limits  URINALYSIS, ROUTINE W REFLEX MICROSCOPIC - Abnormal; Notable for the following components:   Color, Urine YELLOW (*)    APPearance TURBID (*)    Hgb urine dipstick LARGE (*)    Ketones, ur 20 (*)    Protein, ur 30 (*)    Leukocytes,Ua LARGE (*)    Non Squamous Epithelial PRESENT (*)    All other components within normal limits  RESP PANEL BY RT-PCR (RSV, FLU A&B, COVID)  RVPGX2  URINE CULTURE  LIPASE, BLOOD      EKG None  RADIOLOGY None    PROCEDURES:  Critical Care performed: No  Procedures   MEDICATIONS ORDERED IN ED: Medications  cefTRIAXone  (ROCEPHIN ) 1 g in sodium chloride  0.9 % 100 mL IVPB (1 g Intravenous New Bag/Given 11/20/23 2259)  gabapentin  (NEURONTIN ) capsule 300 mg (300 mg Oral Given 11/20/23 12/11/27)  sodium chloride  0.9 % bolus 1,000 mL (1,000 mLs Intravenous New Bag/Given 11/20/23 12-10-24)  ondansetron  (ZOFRAN ) injection 4 mg (4 mg Intravenous Given 11/20/23 12-10-2125)  famotidine  (PEPCID ) IVPB 20 mg premix (0 mg Intravenous Stopped 11/20/23 2254)     IMPRESSION / MDM / ASSESSMENT AND PLAN / ED COURSE                                Differential diagnosis includes, but is not limited to, URI, UTI, sciatica, acute on chronic sciatica  ED course: Patient is well-appearing and has no focal neurological deficits.  Her abdominal exam is completely benign.  She has no spinal tenderness to palpation and I am reassured that patient is adamant that this is a flare of her already known sciatic symptoms.  She does have an elevated blood pressure however this may be secondary to symptoms.  Will give IV fluids Zofran  and a one-time dose of Neurontin  to see if this helps alleviate any of her symptoms.   Clinical Course as of 11/20/23 Dec 10, 2320  Mon Nov 20, 2023  2219 Comprehensive metabolic panel(!) No acute abnormality and creatinine is not elevated [HD]  2217/12/10 CBC(!) Only very mild leukocytosis [HD]  12/11/2242 Urinalysis, Routine w reflex microscopic -Urine, Clean Catch(!) Unclear whether this is secondary to UTI.  Will treat and send for urine culture just in case [HD]  12-11-2247 I reviewed the patient's workup with her and she feels much improved and feels comfortable returning home.  Will send the patient home with a prescription for cefdinir  and Zofran .  Will have the patient follow-up with her primary care physician. [HD]    Clinical Course User Index [HD] Nicholaus Rolland BRAVO, MD   Suggested  E/M Coding Level: 4, [phone]  This level has been selected based on the 12/10/2021 CPT guidelines for E/M codes in the Emergency Department based on 2/3 of the CoPA, Data, and Risk.  COPA: The patient has an acute illness with systemic symptoms: UTI Risk: This patient has a moderate risk of morbidity as evidenced by the following further diagnostic testing or treatment actions: Prescription drug management   FINAL  CLINICAL IMPRESSION(S) / ED DIAGNOSES   Final diagnoses:  Sciatic nerve pain, unspecified laterality  Secondary hypertension  Nausea  Acute cystitis without hematuria     Rx / DC Orders   ED Discharge Orders          Ordered    cefdinir  (OMNICEF ) 300 MG capsule  2 times daily        11/20/23 2256    ondansetron  (ZOFRAN -ODT) 4 MG disintegrating tablet  Every 8 hours PRN        11/20/23 2256             Note:  This document was prepared using Dragon voice recognition software and may include unintentional dictation errors.   Nicholaus Rolland BRAVO, MD 11/20/23 [phone]

## 2023-11-20 NOTE — ED Triage Notes (Signed)
 Pt reports right leg and lower back pain due to her hx of sciatica pt repots her BP has been elevated, hx of HTN, and she began to feel nauseous over the weekend.

## 2023-11-22 LAB — URINE CULTURE: Culture: >=100000 — AB

## 2024-02-28 ENCOUNTER — Other Ambulatory Visit: Payer: Self-pay

## 2024-02-28 ENCOUNTER — Emergency Department: Payer: Self-pay

## 2024-02-28 ENCOUNTER — Emergency Department
Admission: EM | Admit: 2024-02-28 | Discharge: 2024-02-29 | Disposition: A | Discharge status: Home / Self Care | Payer: Self-pay | Attending: Emergency Medicine | Admitting: Emergency Medicine

## 2024-02-28 DIAGNOSIS — R079 Chest pain, unspecified: Secondary | ICD-10-CM

## 2024-02-28 DIAGNOSIS — R0789 Other chest pain: Secondary | ICD-10-CM | POA: Insufficient documentation

## 2024-02-28 DIAGNOSIS — R0602 Shortness of breath: Secondary | ICD-10-CM | POA: Insufficient documentation

## 2024-02-28 DIAGNOSIS — E119 Type 2 diabetes mellitus without complications: Secondary | ICD-10-CM | POA: Insufficient documentation

## 2024-02-28 DIAGNOSIS — I10 Essential (primary) hypertension: Secondary | ICD-10-CM | POA: Insufficient documentation

## 2024-02-28 LAB — COMPREHENSIVE METABOLIC PANEL WITH GFR
ALT: 32 U/L (ref 0–44)
AST: 54 U/L — ABNORMAL HIGH (ref 15–41)
Albumin: 4.7 g/dL (ref 3.5–5.0)
Alkaline Phosphatase: 92 U/L (ref 38–126)
Anion gap: 13 (ref 5–15)
BUN: 11 mg/dL (ref 6–20)
CO2: 23 mmol/L (ref 22–32)
Calcium: 9.7 mg/dL (ref 8.9–10.3)
Chloride: 102 mmol/L (ref 98–111)
Creatinine, Ser: 0.61 mg/dL (ref 0.44–1.00)
GFR, Estimated: >60 mL/min (ref >60)
Glucose, Bld: 162 mg/dL — ABNORMAL HIGH (ref 70–99)
Potassium: 3.6 mmol/L (ref 3.5–5.1)
Sodium: 138 mmol/L (ref 135–145)
Total Bilirubin: 0.8 mg/dL (ref 0.0–1.2)
Total Protein: 8.1 g/dL (ref 6.5–8.1)

## 2024-02-28 LAB — CBC WITH DIFFERENTIAL/PLATELET
Abs Immature Granulocytes: 0.03 K/uL (ref 0.00–0.07)
Basophils Absolute: 0.1 K/uL (ref 0.0–0.1)
Basophils Relative: 1 %
Eosinophils Absolute: 0.1 K/uL (ref 0.0–0.5)
Eosinophils Relative: 1 %
HCT: 45.3 % (ref 36.0–46.0)
Hemoglobin: 14.6 g/dL (ref 12.0–15.0)
Immature Granulocytes: 0 %
Lymphocytes Relative: 30 %
Lymphs Abs: 3.2 K/uL (ref 0.7–4.0)
MCH: 31.1 pg (ref 26.0–34.0)
MCHC: 32.2 g/dL (ref 30.0–36.0)
MCV: 96.4 fL (ref 80.0–100.0)
Monocytes Absolute: 0.5 K/uL (ref 0.1–1.0)
Monocytes Relative: 4 %
Neutro Abs: 6.9 K/uL (ref 1.7–7.7)
Neutrophils Relative %: 64 %
Platelets: 361 K/uL (ref 150–400)
RBC: 4.7 MIL/uL (ref 3.87–5.11)
RDW: 12.2 % (ref 11.5–15.5)
WBC: 10.7 K/uL — ABNORMAL HIGH (ref 4.0–10.5)
nRBC: 0 % (ref 0.0–0.2)

## 2024-02-28 LAB — TROPONIN T, HIGH SENSITIVITY: Troponin T High Sensitivity: <15 ng/L (ref 0–19)

## 2024-02-28 NOTE — ED Triage Notes (Signed)
 Ambulatory to triage with CC of R sided CP described as extreme pressure since 1730 today with assoc SOB. Denies N/V, dizziness, and lightheadedness.

## 2024-02-29 LAB — LIPASE, BLOOD: Lipase: 54 U/L — ABNORMAL HIGH (ref 11–51)

## 2024-02-29 LAB — D-DIMER, QUANTITATIVE: D-Dimer, Quant: <0.27 ug{FEU}/mL (ref 0.00–0.50)

## 2024-02-29 LAB — TROPONIN T, HIGH SENSITIVITY: Troponin T High Sensitivity: <15 ng/L (ref 0–19)

## 2024-02-29 MED ORDER — KETOROLAC TROMETHAMINE 15 MG/ML IJ SOLN
15.0000 mg | Freq: Once | INTRAMUSCULAR | Status: AC
  Administered 2024-02-29: 15 mg via INTRAVENOUS
  Filled 2024-02-29: qty 1

## 2024-02-29 NOTE — ED Provider Notes (Signed)
 [facility] Provider Note    Event Date/Time   First MD Initiated Contact with Patient 02/28/24 2355     (approximate)   History   Chest Pain   HPI  Heidi Cook is a 43 y.o. female   Past medical history of hypertension and diabetes who presents to the emergency department with chest pressure.  Started this afternoon while at rest.  Pain worse with twisting and turning movements and with palpation of the chest.  Some associated shortness of breath.  No respiratory infectious symptoms.  No leg swelling or pain no history of clots.  No hormone use or immobilization.  No GI or GU complaints.  External Medical Documents Reviewed: Prior outpatient notes      Physical Exam   Triage Vital Signs: ED Triage Vitals  Encounter Vitals Group     BP 02/28/24 2204 (!) 161/113     Girls Systolic BP Percentile --      Girls Diastolic BP Percentile --      Boys Systolic BP Percentile --      Boys Diastolic BP Percentile --      Pulse Rate 02/28/24 2204 93     Resp 02/28/24 2204 19     Temp 02/28/24 2204 97.9 F (36.6 C)     Temp Source 02/28/24 2204 Oral     SpO2 02/28/24 2204 100 %     Weight 02/28/24 2205 234 lb (106.1 kg)     Height 02/28/24 2205 5' 10 (1.778 m)     Head Circumference --      Peak Flow --      Pain Score 02/28/24 2205 8     Pain Loc --      Pain Education --      Exclude from Growth Chart --     Most recent vital signs: Vitals:   02/29/24 0030 02/29/24 0130  BP: (!) 149/94 (!) 154/101  Pulse: 74 69  Resp:  18  Temp:  97.7 F (36.5 C)  SpO2: 98% 99%    General: Awake, no distress.  CV:  Good peripheral perfusion.  Resp:  Normal effort.  Abd:  No distention.  Other:  Comfortable appearing, hypertensive at triage normalized to 120/90 when I have seen her.  Chest wall tenderness to palpation without skin rash.  Soft nontender abdomen to deep palpation all quadrants.  Clear lungs good air movement throughout.  No  obvious heart murmur.  Skin appears warm and well-perfused.   ED Results / Procedures / Treatments   Labs (all labs ordered are listed, but only abnormal results are displayed) Labs Reviewed  CBC WITH DIFFERENTIAL/PLATELET - Abnormal; Notable for the following components:      Result Value   WBC 10.7 (*)    All other components within normal limits  COMPREHENSIVE METABOLIC PANEL WITH GFR - Abnormal; Notable for the following components:   Glucose, Bld 162 (*)    AST 54 (*)    All other components within normal limits  LIPASE, BLOOD - Abnormal; Notable for the following components:   Lipase 54 (*)    All other components within normal limits  D-DIMER, QUANTITATIVE (NOT AT [facility])  TROPONIN T, HIGH SENSITIVITY  TROPONIN T, HIGH SENSITIVITY     I ordered and reviewed the above labs they are notable for cell counts and electrolytes and initial troponin unremarkable.  EKG  ED ECG REPORT I, Ginnie Shams, the attending physician, personally viewed and interpreted this ECG.  Date: 02/29/2024  EKG Time: 2206  Rate: 84  Rhythm: sinus  Axis: nl  Intervals:nl  ST&T Change: no stemi    RADIOLOGY I independently reviewed and interpreted chest x-ray and I see no obvious focality pneumothorax I also reviewed radiologist's formal read.   PROCEDURES:  Critical Care performed: No  Procedures   MEDICATIONS ORDERED IN ED: Medications  ketorolac  (TORADOL ) 15 MG/ML injection 15 mg (15 mg Intravenous Given 02/29/24 0035)     IMPRESSION / MDM / ASSESSMENT AND PLAN / ED COURSE  I reviewed the triage vital signs and the nursing notes.                                Patient's presentation is most consistent with acute presentation with potential threat to life or bodily function.  Differential diagnosis includes, but is not limited to, ACS, dissection, PE, costochondritis, shingles, respiratory infection, rib fracture, pneumothorax, upper abdominal pathologies like pancreas,  biliary, gastric   The patient is on the cardiac monitor to evaluate for evidence of arrhythmia and/or significant heart rate changes.  MDM:    Well-appearing woman with most likely costochondritis or musculoskeletal chest pain given tenderness to palpation, but considered ACS PE dissection low risk given her age and comorbidities.  EKG nonischemic follow-up with serial troponins.  Check D-dimer given low pretest probability of PE.  Give Toradol .   Workup as above unremarkable, serial troponins flat negative D-dimer.  Patient stable, pain improving without any intervention, probability of cardiopulmonary emergency at this time is very low.  Appropriate for discharge and PMD follow-up.      FINAL CLINICAL IMPRESSION(S) / ED DIAGNOSES   Final diagnoses:  Nonspecific chest pain     Rx / DC Orders   ED Discharge Orders     None        Note:  This document was prepared using Dragon voice recognition software and may include unintentional dictation errors.    Cyrena Mylar, MD 02/29/24 [phone]

## 2024-02-29 NOTE — Discharge Instructions (Addendum)
 Fortunately your evaluation in the emergency department did not show any emergency conditions like blood clot or heart attack to explain your chest pain  Take acetaminophen  650 mg and ibuprofen  400 mg every 6 hours for pain.  Take with food.   Thank you for choosing us  for your health care today!  Please see your primary doctor this week for a follow up appointment.   If you have any new, worsening, or unexpected symptoms call your doctor right away or come back to the emergency department for reevaluation.  It was my pleasure to care for you today.   Ginnie EDISON Cyrena, MD
